# Patient Record
Sex: Female | Born: 1945 | Hispanic: No | Smoking: Former smoker
Health system: Southern US, Community
[De-identification: ages and names within clinical notes are randomized; demographics above are authoritative.]

## PROBLEM LIST (undated history)

## (undated) DIAGNOSIS — E079 Disorder of thyroid, unspecified: Secondary | ICD-10-CM

## (undated) DIAGNOSIS — Z8619 Personal history of other infectious and parasitic diseases: Secondary | ICD-10-CM

## (undated) DIAGNOSIS — M8580 Other specified disorders of bone density and structure, unspecified site: Secondary | ICD-10-CM

## (undated) DIAGNOSIS — E785 Hyperlipidemia, unspecified: Secondary | ICD-10-CM

## (undated) DIAGNOSIS — M5020 Other cervical disc displacement, unspecified cervical region: Secondary | ICD-10-CM

## (undated) DIAGNOSIS — I6350 Cerebral infarction due to unspecified occlusion or stenosis of unspecified cerebral artery: Secondary | ICD-10-CM

## (undated) DIAGNOSIS — G4733 Obstructive sleep apnea (adult) (pediatric): Secondary | ICD-10-CM

## (undated) DIAGNOSIS — K219 Gastro-esophageal reflux disease without esophagitis: Secondary | ICD-10-CM

## (undated) DIAGNOSIS — F419 Anxiety disorder, unspecified: Secondary | ICD-10-CM

## (undated) DIAGNOSIS — M1990 Unspecified osteoarthritis, unspecified site: Secondary | ICD-10-CM

## (undated) DIAGNOSIS — I10 Essential (primary) hypertension: Secondary | ICD-10-CM

## (undated) HISTORY — DX: Other specified disorders of bone density and structure, unspecified site: M85.80

## (undated) HISTORY — DX: Other cervical disc displacement, unspecified cervical region: M50.20

## (undated) HISTORY — DX: Gastro-esophageal reflux disease without esophagitis: K21.9

## (undated) HISTORY — DX: Personal history of other infectious and parasitic diseases: Z86.19

## (undated) HISTORY — DX: Anxiety disorder, unspecified: F41.9

## (undated) HISTORY — DX: Disorder of thyroid, unspecified: E07.9

## (undated) HISTORY — DX: Hyperlipidemia, unspecified: E78.5

## (undated) HISTORY — DX: Obstructive sleep apnea (adult) (pediatric): G47.33

## (undated) HISTORY — DX: Unspecified osteoarthritis, unspecified site: M19.90

## (undated) HISTORY — PX: CHOLECYSTECTOMY: SHX55

## (undated) HISTORY — DX: Essential (primary) hypertension: I10

## (undated) HISTORY — DX: Cerebral infarction due to unspecified occlusion or stenosis of unspecified cerebral artery: I63.50

## (undated) HISTORY — PX: ABDOMINAL HYSTERECTOMY: SHX81

## (undated) HISTORY — PX: AUGMENTATION MAMMAPLASTY: SUR837

---

## 1986-06-20 HISTORY — PX: SPINAL FUSION: SHX223

## 1998-06-20 DIAGNOSIS — M791 Myalgia, unspecified site: Secondary | ICD-10-CM

## 2004-06-20 DIAGNOSIS — G2581 Restless legs syndrome: Secondary | ICD-10-CM

## 2004-06-20 HISTORY — PX: TOTAL HIP ARTHROPLASTY: SHX124

## 2006-07-30 DIAGNOSIS — E7801 Familial hypercholesterolemia: Secondary | ICD-10-CM

## 2006-07-30 DIAGNOSIS — Z86010 Personal history of colonic polyps: Secondary | ICD-10-CM

## 2006-07-30 DIAGNOSIS — M5417 Radiculopathy, lumbosacral region: Secondary | ICD-10-CM

## 2006-07-30 DIAGNOSIS — G4733 Obstructive sleep apnea (adult) (pediatric): Secondary | ICD-10-CM | POA: Insufficient documentation

## 2006-07-30 DIAGNOSIS — I10 Essential (primary) hypertension: Secondary | ICD-10-CM

## 2006-07-30 DIAGNOSIS — E039 Hypothyroidism, unspecified: Secondary | ICD-10-CM | POA: Insufficient documentation

## 2006-11-01 ENCOUNTER — Ambulatory Visit: Payer: Self-pay | Admitting: Family Medicine

## 2006-11-01 DIAGNOSIS — F411 Generalized anxiety disorder: Secondary | ICD-10-CM

## 2006-11-26 ENCOUNTER — Emergency Department: Payer: Self-pay | Admitting: Emergency Medicine

## 2006-11-26 ENCOUNTER — Other Ambulatory Visit: Payer: Self-pay

## 2006-12-09 DIAGNOSIS — M5136 Other intervertebral disc degeneration, lumbar region: Secondary | ICD-10-CM

## 2006-12-09 DIAGNOSIS — I6350 Cerebral infarction due to unspecified occlusion or stenosis of unspecified cerebral artery: Secondary | ICD-10-CM

## 2007-03-22 DIAGNOSIS — R609 Edema, unspecified: Secondary | ICD-10-CM

## 2007-04-25 ENCOUNTER — Ambulatory Visit: Payer: Self-pay | Admitting: Cardiology

## 2007-05-09 DIAGNOSIS — I2510 Atherosclerotic heart disease of native coronary artery without angina pectoris: Secondary | ICD-10-CM

## 2007-09-17 ENCOUNTER — Encounter: Payer: Self-pay | Admitting: Family Medicine

## 2007-09-19 ENCOUNTER — Encounter: Payer: Self-pay | Admitting: Family Medicine

## 2007-10-19 ENCOUNTER — Encounter: Payer: Self-pay | Admitting: Family Medicine

## 2008-02-29 ENCOUNTER — Ambulatory Visit: Payer: Self-pay | Admitting: Family Medicine

## 2008-02-29 DIAGNOSIS — K5900 Constipation, unspecified: Secondary | ICD-10-CM

## 2009-08-09 ENCOUNTER — Ambulatory Visit: Payer: Self-pay | Admitting: Internal Medicine

## 2009-12-09 DIAGNOSIS — L659 Nonscarring hair loss, unspecified: Secondary | ICD-10-CM

## 2010-02-23 DIAGNOSIS — R5381 Other malaise: Secondary | ICD-10-CM | POA: Insufficient documentation

## 2010-02-23 DIAGNOSIS — R5383 Other fatigue: Secondary | ICD-10-CM

## 2010-05-10 ENCOUNTER — Ambulatory Visit: Payer: Self-pay | Admitting: Internal Medicine

## 2010-05-18 ENCOUNTER — Ambulatory Visit: Payer: Self-pay | Admitting: Internal Medicine

## 2010-07-21 HISTORY — PX: OTHER SURGICAL HISTORY: SHX169

## 2011-03-10 ENCOUNTER — Ambulatory Visit: Payer: Self-pay | Admitting: Internal Medicine

## 2011-05-24 ENCOUNTER — Ambulatory Visit: Payer: Self-pay | Admitting: Gastroenterology

## 2011-05-24 LAB — HM COLONOSCOPY

## 2012-07-04 ENCOUNTER — Ambulatory Visit: Payer: Self-pay | Admitting: Ophthalmology

## 2012-07-17 ENCOUNTER — Ambulatory Visit: Payer: Self-pay | Admitting: Ophthalmology

## 2013-01-04 ENCOUNTER — Ambulatory Visit: Payer: Self-pay | Admitting: Family Medicine

## 2013-01-04 HISTORY — PX: OTHER SURGICAL HISTORY: SHX169

## 2013-01-04 LAB — CREATININE, SERUM
Creatinine: 1.08 mg/dL (ref 0.60–1.30)
EGFR (African American): >60
EGFR (Non-African Amer.): 53 — ABNORMAL LOW

## 2013-04-01 ENCOUNTER — Ambulatory Visit: Payer: Self-pay | Admitting: Otolaryngology

## 2013-04-04 HISTORY — PX: DOPPLER ECHOCARDIOGRAPHY: SHX263

## 2013-04-05 HISTORY — PX: OTHER SURGICAL HISTORY: SHX169

## 2013-04-10 ENCOUNTER — Ambulatory Visit: Payer: Self-pay | Admitting: Otolaryngology

## 2013-04-12 LAB — PATHOLOGY REPORT

## 2014-05-05 LAB — LIPID PANEL
Cholesterol: 200 mg/dL (ref 0–200)
HDL: 42 mg/dL (ref 35–70)
LDL Cholesterol: 110 mg/dL
Triglycerides: 241 mg/dL — AB (ref 40–160)

## 2014-05-22 ENCOUNTER — Ambulatory Visit: Payer: Self-pay | Admitting: Family Medicine

## 2014-05-22 DIAGNOSIS — M8580 Other specified disorders of bone density and structure, unspecified site: Secondary | ICD-10-CM | POA: Insufficient documentation

## 2014-05-22 LAB — HM DEXA SCAN

## 2014-07-23 DIAGNOSIS — E039 Hypothyroidism, unspecified: Secondary | ICD-10-CM | POA: Diagnosis not present

## 2014-08-14 DIAGNOSIS — Z8673 Personal history of transient ischemic attack (TIA), and cerebral infarction without residual deficits: Secondary | ICD-10-CM | POA: Insufficient documentation

## 2014-08-14 DIAGNOSIS — I10 Essential (primary) hypertension: Secondary | ICD-10-CM | POA: Diagnosis not present

## 2014-08-14 DIAGNOSIS — I2510 Atherosclerotic heart disease of native coronary artery without angina pectoris: Secondary | ICD-10-CM | POA: Diagnosis not present

## 2014-08-14 DIAGNOSIS — E782 Mixed hyperlipidemia: Secondary | ICD-10-CM | POA: Diagnosis not present

## 2014-08-25 DIAGNOSIS — G2581 Restless legs syndrome: Secondary | ICD-10-CM | POA: Diagnosis not present

## 2014-08-25 DIAGNOSIS — R5381 Other malaise: Secondary | ICD-10-CM | POA: Diagnosis not present

## 2014-08-25 DIAGNOSIS — E559 Vitamin D deficiency, unspecified: Secondary | ICD-10-CM | POA: Diagnosis not present

## 2014-08-25 DIAGNOSIS — G4733 Obstructive sleep apnea (adult) (pediatric): Secondary | ICD-10-CM | POA: Diagnosis not present

## 2014-08-25 LAB — HEPATIC FUNCTION PANEL
ALT: 33 U/L (ref 7–35)
AST: 20 U/L (ref 13–35)

## 2014-08-25 LAB — BASIC METABOLIC PANEL
BUN: 14 mg/dL (ref 4–21)
CREATININE: 0.8 mg/dL (ref 0.5–1.1)
GLUCOSE: 95 mg/dL
POTASSIUM: 3.7 mmol/L (ref 3.4–5.3)
Sodium: 145 mmol/L (ref 137–147)

## 2014-08-25 LAB — CBC AND DIFFERENTIAL
HCT: 35 % — AB (ref 36–46)
Hemoglobin: 11.6 g/dL — AB (ref 12.0–16.0)
Platelets: 348 10*3/uL (ref 150–399)
WBC: 7.9 10*3/mL

## 2014-09-18 DIAGNOSIS — R0602 Shortness of breath: Secondary | ICD-10-CM | POA: Diagnosis not present

## 2014-10-10 NOTE — Op Note (Signed)
PATIENT NAME:  Morgan Lynch, Morgan Lynch MR#:  [id number] DATE OF BIRTH:  08-Apr-1946  DATE OF PROCEDURE:  04/10/2013  PREOPERATIVE DIAGNOSES: Chronic maxillary, ethmoid, and frontal sinusitis.   POSTOPERATIVE DIAGNOSES: Chronic maxillary, ethmoid, and frontal sinusitis.   PROCEDURES:  1.  Bilateral endoscopic maxillary antrostomies with tissue removal.  2.  Bilateral endoscopic anterior ethmoidectomies.  3.  Bilateral endoscopic frontal recess exploration with tissue removal.  4.  Computer-assisted image-guided surgery.     SURGEON: Marion Downer, MD.  ANESTHESIA: General endotracheal.   INDICATIONS: This is a 69 year old female with a history of chronic sinusitis unresponsive to medical management.   FINDINGS: She had bilateral mucopyoceles involving the maxillary sinuses. There was also puss in the left frontal sinus. A culture was taken from the purulence in the left maxillary. There was significant mucosal slit swelling and polypoid mucosal thickening involving the frontal maxillary and ethmoid sinuses bilaterally.   COMPLICATIONS: None.   DESCRIPTION OF PROCEDURE: After obtaining informed consent, the patient was taken to the operating room and placed in the supine position. After induction of general endotracheal anesthesia, the patient was turned 90 degrees. The nose was decongested with Afrin pledgets. Then 1% lidocaine with epinephrine 1:200,000 was injected into the middle meatus on either side in the region of the uncinate process and middle turbinate. The Stryker image-guided mask was then placed in the usual fashion and the patient registered with the image-guided system. She was then prepped and draped in the usual sterile fashion.   The left nasal cavity was inspected endoscopically with a 0-degree scope. The image-guided suction was registered with the system and known points assessed to checked for accuracy which was deemed to be excellent.   The uncinate process was resected using  a combination of pediatric backbiters through-cutting forceps, and the microdebrider. Soft tissue and bone were removed from the region of the natural ostium back to the secondary os to create a large patent maxillary antrostomy. Pus was found in the maxillary sinus and this was suctioned, cultured, and then the sinus irrigated. The image-guided suction was used to assess the anatomy and the anterior ethmoids entered inferomedially and widely opened.   Dissection proceeded up into the frontal recess using through-cutting forceps to resect soft tissue and bone to open the frontal recess until the frontal duct was identified. A curved image-guided suction was used to frequently reassess the anatomy during this dissection to ensure safety. The frontal duct was identified. There was a lot of mucosal thickening in that area and this was resected at least anteriorly with a frontal backbiter to help open the frontal duct adequately. The mucosa laterally and posteriorly was not disturbed so as not to encourage scarring. The frontal duct was widely opened in this fashion.   Attention was then turned to the right side. The same procedure was performed on the right, again opening a large maxillary antrostomy as described above, opening the anterior ethmoids and dissecting the frontal recess. Once again image-guided suctions were used to help reassess the anatomy frequently to ensure safety and prevent injury to the skull base or lamina papyracea.   Once the procedure was completed, the nose was suctioned to remove any blood clot and Stammberger absorbable packing was placed in the middle meatus bilaterally. The patient was then returned to the anesthesiologist for awakening. She was awakened and taken to the recovery room in good condition postoperatively.   ESTIMATED BLOOD LOSS: Approximately 75 mL.   ____________________________ Ollen Gross. Willeen Cass, MD psb:np D:  04/10/2013 14:27:01 ET T: 04/10/2013 15:10:05  ET JOB#: [id number]  cc: Ollen GrossPaul S. Willeen CassBennett, MD, <Dictator> Sandi MealyPAUL S Shaurya Rawdon MD ELECTRONICALLY SIGNED 04/23/2013 12:07

## 2014-10-10 NOTE — Op Note (Signed)
PATIENT NAME:  [name], Morgan Lynch MR#:  [id number] DATE OF BIRTH:  [birth year]  DATE OF PROCEDURE:  07/17/2012  PREOPERATIVE DIAGNOSIS: Visually significant cataract of the left eye.   POSTOPERATIVE DIAGNOSIS: Visually significant cataract of the left eye.   OPERATIVE PROCEDURE: Cataract extraction by phacoemulsification with implant of intraocular lens to left eye.   SURGEON: Galen ManilaWilliam Laster Appling, MD   ANESTHESIA:  1.  Managed anesthesia care.  2.  Topical tetracaine drops followed by 2% Xylocaine jelly applied in the preoperative holding area.   COMPLICATIONS: None.   TECHNIQUE: Stop and chop.   DESCRIPTION OF PROCEDURE: The patient was examined and consented in the preoperative holding area where the aforementioned topical anesthesia was applied to the left eye and then brought back to the Operating Room where the left eye was prepped and draped in the usual sterile ophthalmic fashion and a lid speculum was placed. A paracentesis was created with the side port blade and the anterior chamber was filled with viscoelastic. A near clear corneal incision was performed with the steel keratome. A continuous curvilinear capsulorrhexis was performed with a cystotome followed by the capsulorrhexis forceps. Hydrodissection and hydrodelineation were carried out with BSS on a blunt cannula. The lens was removed in a stop and chop technique and the remaining cortical material was removed with the irrigation-aspiration handpiece. The capsular bag was inflated with viscoelastic and the Technis ZCT 225 18.5-diopter lens, serial number [id number] was placed in the capsular bag without complication. The lens was rotated to a final resting position of 88 degrees.  The remaining viscoelastic was removed from the eye with the irrigation-aspiration handpiece. The wounds were hydrated. The anterior chamber was flushed with Miostat and the eye was inflated to physiologic pressure. 0.1 mL of cefuroxime concentration 10 mg/mL  was placed in the anterior chamber. The wounds were found to be water tight. The eye was dressed with Vigamox. The patient was given protective glasses to wear throughout the day and a shield with which to sleep tonight. The patient was also given drops with which to begin a drop regimen today and will follow-up with me in one day.     ____________________________ Jerilee FieldWilliam L. Nyrah Demos, MD wlp:cc D: 07/17/2012 15:49:00 ET T: 07/17/2012 16:42:51 ET JOB#: [id number]  cc: Nick Armel L. Taeya Theall, MD, <Dictator>  Jerilee FieldWILLIAM L Constantine Ruddick MD ELECTRONICALLY SIGNED 07/26/2012 14:28

## 2014-10-22 DIAGNOSIS — Z0000 Encounter for general adult medical examination without abnormal findings: Secondary | ICD-10-CM | POA: Diagnosis not present

## 2014-10-22 DIAGNOSIS — J309 Allergic rhinitis, unspecified: Secondary | ICD-10-CM | POA: Diagnosis not present

## 2014-11-26 ENCOUNTER — Other Ambulatory Visit: Payer: Self-pay | Admitting: Family Medicine

## 2014-11-26 MED ORDER — NUVIGIL 250 MG PO TABS: 250.0000 mg | ORAL_TABLET | Freq: Every day | ORAL | Status: DC

## 2014-11-26 NOTE — Telephone Encounter (Signed)
Pt stated that [facility] was supposed to have request a refill for Nuvigill 250 mg and [facility] [place]. Thanks TNP

## 2014-11-26 NOTE — Telephone Encounter (Signed)
Refill request for Nuvigil 250mg  Last filled by MD on- 05/21/2014 #30 x5 Last Appt: 10/22/2014 Next Appt: none Please advise refill?

## 2014-12-29 ENCOUNTER — Other Ambulatory Visit: Payer: Self-pay | Admitting: Family Medicine

## 2014-12-29 NOTE — Telephone Encounter (Signed)
Pt contacted office for refill request on the following medications:  HYDROcodone-acetaminophen (NORCO) 7.5-325 MG.  CB#[phone]/MJ

## 2014-12-29 NOTE — Telephone Encounter (Signed)
Refill request for Hydro-Ace 7.5-325 mg Last filled by MD on- 11/03/2014 #120 x0 Last Appt: 10/22/2014 Next Appt: none Please advise refill?

## 2014-12-30 MED ORDER — HYDROCODONE-ACETAMINOPHEN 7.5-325 MG PO TABS
1.0000 | ORAL_TABLET | Freq: Four times a day (QID) | ORAL | Status: DC | PRN
Start: 2014-12-30 — End: 2015-03-06

## 2015-01-19 ENCOUNTER — Other Ambulatory Visit: Payer: Self-pay | Admitting: Family Medicine

## 2015-01-19 NOTE — Telephone Encounter (Signed)
Last ov was on 10/22/2014

## 2015-01-26 ENCOUNTER — Other Ambulatory Visit: Payer: Self-pay | Admitting: Family Medicine

## 2015-01-26 DIAGNOSIS — Z1231 Encounter for screening mammogram for malignant neoplasm of breast: Secondary | ICD-10-CM

## 2015-01-28 ENCOUNTER — Other Ambulatory Visit: Payer: Self-pay | Admitting: Family Medicine

## 2015-01-28 ENCOUNTER — Ambulatory Visit
Admission: RE | Admit: 2015-01-28 | Discharge: 2015-01-28 | Disposition: A | Discharge status: Home / Self Care | Payer: Medicare Other | Source: Ambulatory Visit | Attending: Family Medicine | Admitting: Family Medicine

## 2015-01-28 DIAGNOSIS — Z1231 Encounter for screening mammogram for malignant neoplasm of breast: Secondary | ICD-10-CM | POA: Insufficient documentation

## 2015-02-06 ENCOUNTER — Other Ambulatory Visit: Payer: Self-pay | Admitting: Family Medicine

## 2015-02-17 ENCOUNTER — Other Ambulatory Visit: Payer: Self-pay | Admitting: Family Medicine

## 2015-03-04 DIAGNOSIS — K219 Gastro-esophageal reflux disease without esophagitis: Secondary | ICD-10-CM | POA: Insufficient documentation

## 2015-03-04 DIAGNOSIS — R200 Anesthesia of skin: Secondary | ICD-10-CM | POA: Insufficient documentation

## 2015-03-04 DIAGNOSIS — E669 Obesity, unspecified: Secondary | ICD-10-CM | POA: Insufficient documentation

## 2015-03-04 DIAGNOSIS — M479 Spondylosis, unspecified: Secondary | ICD-10-CM | POA: Insufficient documentation

## 2015-03-04 DIAGNOSIS — E559 Vitamin D deficiency, unspecified: Secondary | ICD-10-CM | POA: Insufficient documentation

## 2015-03-04 DIAGNOSIS — J309 Allergic rhinitis, unspecified: Secondary | ICD-10-CM | POA: Insufficient documentation

## 2015-03-04 DIAGNOSIS — R198 Other specified symptoms and signs involving the digestive system and abdomen: Secondary | ICD-10-CM | POA: Insufficient documentation

## 2015-03-04 DIAGNOSIS — R252 Cramp and spasm: Secondary | ICD-10-CM | POA: Insufficient documentation

## 2015-03-04 DIAGNOSIS — R143 Flatulence: Secondary | ICD-10-CM | POA: Insufficient documentation

## 2015-03-04 DIAGNOSIS — G4710 Hypersomnia, unspecified: Secondary | ICD-10-CM | POA: Insufficient documentation

## 2015-03-06 ENCOUNTER — Ambulatory Visit (INDEPENDENT_AMBULATORY_CARE_PROVIDER_SITE_OTHER): Payer: Medicare Other | Admitting: Family Medicine

## 2015-03-06 ENCOUNTER — Encounter: Payer: Self-pay | Admitting: Family Medicine

## 2015-03-06 VITALS — BP 116/64 | HR 76 | Temp 98.6°F | Resp 16 | Ht 61.0 in | Wt 210.0 lb

## 2015-03-06 DIAGNOSIS — G4733 Obstructive sleep apnea (adult) (pediatric): Secondary | ICD-10-CM

## 2015-03-06 DIAGNOSIS — M5417 Radiculopathy, lumbosacral region: Secondary | ICD-10-CM

## 2015-03-06 DIAGNOSIS — G4710 Hypersomnia, unspecified: Secondary | ICD-10-CM | POA: Diagnosis not present

## 2015-03-06 MED ORDER — NUVIGIL 250 MG PO TABS: 250.0000 mg | ORAL_TABLET | Freq: Every day | ORAL | Status: DC

## 2015-03-06 MED ORDER — HYDROCODONE-ACETAMINOPHEN 7.5-325 MG PO TABS: 1.0000 | ORAL_TABLET | Freq: Four times a day (QID) | ORAL | Status: DC | PRN

## 2015-03-06 NOTE — Patient Instructions (Signed)
Please call for referral to sleep specialist if not improving on Brand Name Nuvigil not helping.

## 2015-03-06 NOTE — Progress Notes (Signed)
Patient: Morgan Lynch Female    DOB: 07/27/1945   69 y.o.   MRN: [id number] Visit Date: 03/06/2015  Today's Provider: Mila Merry, MD   Chief Complaint  Patient presents with  . Sleeping Problem   Subjective:    HPI Excessive sleepiness: Patient comes in today stating that she has had problems with staying awake. Patient is currently taking Generic Nuvigil to help with excessive sleepiness. Patient reports that she was on the Brand name Nuvigil several months ago and it helped to control the sleepiness. Since changing to the Generic Nuvigil, patient states it is not working to help with hypersomnia. Patient reports that she has fallen asleep on 3 sepearate occasions while driving her car in the middle of the day. Patient described this as worsening.  She does have established history of sleep apnea and reports that she uses her CPAP consistently every night. She had o/n oximetry with her CPAP oin March of this year and only had one desaturation.      Allergies  Allergen Reactions  . Oysters  [Shellfish Allergy]   . Simvastatin     Muscle Aches   Previous Medications   ALBUTEROL (PROAIR HFA) 108 (90 BASE) MCG/ACT INHALER    Inhale 2 puffs into the lungs every 6 (six) hours as needed.    ASPIRIN 81 MG TABLET    Take 162 mg by mouth daily.    EPINEPHRINE (EPIPEN 2-PAK) 0.3 MG/0.3 ML IJ SOAJ INJECTION    EPIPEN 2-PAK, 0.3MG /0.3ML (Injection Solution Auto-injector)  1 (one) injection injection as directed for 0 days  Quantity: 1;  Refills: 1   Ordered :10-Jun-2014  Allene Dillon ;  Started 10-Jun-2014 Active   FLUOXETINE HCL, PMDD, 20 MG CAPS    Take 3 capsules by mouth daily.    FLUTICASONE (FLONASE) 50 MCG/ACT NASAL SPRAY    Place 2 sprays into both nostrils daily.   HYDROCODONE-ACETAMINOPHEN (NORCO) 7.5-325 MG PER TABLET    Take 1 tablet by mouth every 6 (six) hours as needed for moderate pain.   IPRATROPIUM (ATROVENT) 0.06 % NASAL SPRAY    Place into the nose.     LEVOTHYROXINE (SYNTHROID, LEVOTHROID) 175 MCG TABLET    Take 175 mcg by mouth daily.    MONTELUKAST (SINGULAIR) 10 MG TABLET    TAKE 1 TABLET BY MOUTH DAILY   NAPROXEN (NAPROSYN) 500 MG TABLET    Take 500 mg by mouth 2 (two) times daily as needed.    NUVIGIL 250 MG TABLET    Take 1 tablet (250 mg total) by mouth daily.   OMEPRAZOLE (PRILOSEC) 20 MG CAPSULE    Take 20 mg by mouth daily.    TIZANIDINE (ZANAFLEX) 4 MG TABLET    TAKE 1 TABLET BY MOUTH EVERY 8 HOURS AS NEEDED   TOPIRAMATE (TOPAMAX) 100 MG TABLET    TAKE 1 TABLET BY MOUTH TWICE DAILY   TOPIRAMATE (TOPAMAX) 6 MG/ML SUSP    Take by mouth.   TRIAMCINOLONE CREAM (KENALOG) 0.5 %    APPLY TOPICALLY UP TO THREE TIMES DAILY AS NEEDED   VITAMIN D, ERGOCALCIFEROL, (DRISDOL) 50000 UNITS CAPS CAPSULE    Take 50,000 Units by mouth every 7 (seven) days.     Review of Systems  Constitutional: Negative for fever, chills, appetite change and fatigue.  Respiratory: Negative for chest tightness and shortness of breath.   Cardiovascular: Negative for chest pain and palpitations.  Gastrointestinal: Positive for abdominal pain. Negative for nausea and  vomiting.  Neurological: Negative for dizziness and weakness.  Psychiatric/Behavioral: Positive for sleep disturbance (hypersomnia).    Social History  Substance Use Topics  . Smoking status: Former Smoker -- 0.50 packs/day for 8 years    Types: Cigarettes  . Smokeless tobacco: Not on file  . Alcohol Use: No    Objective:   BP 116/64 mmHg  Pulse 76  Temp(Src) 98.6 F (37 C) (Oral)  Resp 16  Ht 5\' 1"  (1.549 m)  Wt 210 lb (95.255 kg)  BMI 39.70 kg/m2  SpO2 97%  Physical Exam  General Appearance:    Alert, cooperative, no distress, obese  Eyes:    PERRL, conjunctiva/corneas clear, EOM's intact       Lungs:     Clear to auscultation bilaterally, respirations unlabored  Heart:    Regular rate and rhythm  Neurologic:   Awake, alert, oriented x 3. No apparent focal neurological            defect.          Assessment & Plan:     1. Hypersomnia She makes a clear link between this symptom and being changed from Brand name to generic Nuvigil. Will see how she does going back to brand name. If not better then she may need evaluation by neurology - NUVIGIL 250 MG tablet; Take 1 tablet (250 mg total) by mouth daily. Brand name medically necessary  Dispense: 30 tablet; Refill: 5  2. Obstructive sleep apnea Seems to be well controlled based on her self report compliance with CPAP and normal o/n oximetry in March.  - NUVIGIL 250 MG tablet; Take 1 tablet (250 mg total) by mouth daily. Brand name medically necessary  Dispense: 30 tablet; Refill: 5  3. Lumbosacral neuritis She request refill for pain medication which she states continues to work well.  - HYDROcodone-acetaminophen (NORCO) 7.5-325 MG per tablet; Take 1 tablet by mouth every 6 (six) hours as needed for moderate pain.  Dispense: 120 tablet; Refill: 0       Mila Merry, MD  [facility]

## 2015-03-09 ENCOUNTER — Telehealth: Payer: Self-pay | Admitting: Family Medicine

## 2015-03-19 ENCOUNTER — Other Ambulatory Visit: Payer: Self-pay | Admitting: Family Medicine

## 2015-03-24 ENCOUNTER — Other Ambulatory Visit: Payer: Self-pay | Admitting: Family Medicine

## 2015-03-28 ENCOUNTER — Other Ambulatory Visit: Payer: Self-pay | Admitting: Family Medicine

## 2015-04-04 ENCOUNTER — Other Ambulatory Visit: Payer: Self-pay | Admitting: Family Medicine

## 2015-04-07 ENCOUNTER — Other Ambulatory Visit: Payer: Self-pay | Admitting: Family Medicine

## 2015-04-07 MED ORDER — IPRATROPIUM BROMIDE 0.06 % NA SOLN: 2.0000 | Freq: Three times a day (TID) | NASAL | Status: DC

## 2015-04-07 NOTE — Telephone Encounter (Signed)
Pt contacted office for refill request on the following medications:  ipratropium (ATROVENT) 0.06 % nasal spray.  [facility] [place]

## 2015-04-09 ENCOUNTER — Other Ambulatory Visit: Payer: Self-pay | Admitting: Family Medicine

## 2015-04-09 DIAGNOSIS — M5417 Radiculopathy, lumbosacral region: Secondary | ICD-10-CM

## 2015-04-09 NOTE — Telephone Encounter (Signed)
Pt is requesting a refill of her HYDROcodone-acetaminophen (NORCO) 7.5-325 MG per tablet , please contact pt at [phone] when ready for pick-up

## 2015-04-10 MED ORDER — HYDROCODONE-ACETAMINOPHEN 7.5-325 MG PO TABS: 1.0000 | ORAL_TABLET | Freq: Four times a day (QID) | ORAL | Status: DC | PRN

## 2015-04-15 ENCOUNTER — Other Ambulatory Visit: Payer: Self-pay

## 2015-04-15 NOTE — Telephone Encounter (Signed)
Pharmacist called requesting refill on medication.

## 2015-04-17 MED ORDER — NYSTATIN 100000 UNIT/ML MT SUSP: OROMUCOSAL | Status: DC

## 2015-04-23 ENCOUNTER — Other Ambulatory Visit: Payer: Self-pay | Admitting: Family Medicine

## 2015-06-01 DIAGNOSIS — S76011A Strain of muscle, fascia and tendon of right hip, initial encounter: Secondary | ICD-10-CM | POA: Diagnosis not present

## 2015-06-01 DIAGNOSIS — M25511 Pain in right shoulder: Secondary | ICD-10-CM | POA: Diagnosis not present

## 2015-06-01 DIAGNOSIS — Z96641 Presence of right artificial hip joint: Secondary | ICD-10-CM | POA: Diagnosis not present

## 2015-06-10 ENCOUNTER — Other Ambulatory Visit: Payer: Self-pay | Admitting: Family Medicine

## 2015-06-17 ENCOUNTER — Other Ambulatory Visit: Payer: Self-pay | Admitting: Family Medicine

## 2015-06-18 ENCOUNTER — Ambulatory Visit (INDEPENDENT_AMBULATORY_CARE_PROVIDER_SITE_OTHER): Payer: Medicare Other | Admitting: Physician Assistant

## 2015-06-18 ENCOUNTER — Encounter: Payer: Self-pay | Admitting: Physician Assistant

## 2015-06-18 VITALS — BP 122/76 | HR 72 | Temp 98.8°F | Resp 18 | Wt 215.0 lb

## 2015-06-18 DIAGNOSIS — Z136 Encounter for screening for cardiovascular disorders: Secondary | ICD-10-CM | POA: Diagnosis not present

## 2015-06-18 DIAGNOSIS — R61 Generalized hyperhidrosis: Secondary | ICD-10-CM | POA: Diagnosis not present

## 2015-06-18 DIAGNOSIS — Z13220 Encounter for screening for lipoid disorders: Secondary | ICD-10-CM | POA: Diagnosis not present

## 2015-06-18 DIAGNOSIS — E039 Hypothyroidism, unspecified: Secondary | ICD-10-CM

## 2015-06-18 DIAGNOSIS — H66001 Acute suppurative otitis media without spontaneous rupture of ear drum, right ear: Secondary | ICD-10-CM | POA: Diagnosis not present

## 2015-06-18 DIAGNOSIS — R252 Cramp and spasm: Secondary | ICD-10-CM

## 2015-06-18 DIAGNOSIS — E538 Deficiency of other specified B group vitamins: Secondary | ICD-10-CM | POA: Diagnosis not present

## 2015-06-18 DIAGNOSIS — K141 Geographic tongue: Secondary | ICD-10-CM | POA: Diagnosis not present

## 2015-06-18 MED ORDER — AMOXICILLIN 875 MG PO TABS: 875.0000 mg | ORAL_TABLET | Freq: Two times a day (BID) | ORAL | Status: DC

## 2015-06-18 NOTE — Patient Instructions (Signed)
Otitis Media, Adult Otitis media is redness, soreness, and inflammation of the middle ear. Otitis media may be caused by allergies or, most commonly, by infection. Often it occurs as a complication of the common cold. SIGNS AND SYMPTOMS Symptoms of otitis media may include:  Earache.  Fever.  Ringing in your ear.  Headache.  Leakage of fluid from the ear. DIAGNOSIS To diagnose otitis media, your health care provider will examine your ear with an otoscope. This is an instrument that allows your health care provider to see into your ear in order to examine your eardrum. Your health care provider also will ask you questions about your symptoms. TREATMENT  Typically, otitis media resolves on its own within 3-5 days. Your health care provider may prescribe medicine to ease your symptoms of pain. If otitis media does not resolve within 5 days or is recurrent, your health care provider may prescribe antibiotic medicines if he or she suspects that a bacterial infection is the cause. HOME CARE INSTRUCTIONS   If you were prescribed an antibiotic medicine, finish it all even if you start to feel better.  Take medicines only as directed by your health care provider.  Keep all follow-up visits as directed by your health care provider. SEEK MEDICAL CARE IF:  You have otitis media only in one ear, or bleeding from your nose, or both.  You notice a lump on your neck.  You are not getting better in 3-5 days.  You feel worse instead of better. SEEK IMMEDIATE MEDICAL CARE IF:   You have pain that is not controlled with medicine.  You have swelling, redness, or pain around your ear or stiffness in your neck.  You notice that part of your face is paralyzed.  You notice that the bone behind your ear (mastoid) is tender when you touch it. MAKE SURE YOU:   Understand these instructions.  Will watch your condition.  Will get help right away if you are not doing well or get worse.   This  information is not intended to replace advice given to you by your health care provider. Make sure you discuss any questions you have with your health care provider.   Document Released: 03/11/2004 Document Revised: 06/27/2014 Document Reviewed: 01/01/2013 Elsevier Interactive Patient Education 2016 Else[employer] Inc. Muscle Cramps and Spasms Muscle cramps and spasms occur when a muscle or muscles tighten and you have no control over this tightening (involuntary muscle contraction). They are a common problem and can develop in any muscle. The most common place is in the calf muscles of the leg. Both muscle cramps and muscle spasms are involuntary muscle contractions, but they also have differences:   Muscle cramps are sporadic and painful. They may last a few seconds to a quarter of an hour. Muscle cramps are often more forceful and last longer than muscle spasms.  Muscle spasms may or may not be painful. They may also last just a few seconds or much longer. CAUSES  It is uncommon for cramps or spasms to be due to a serious underlying problem. In many cases, the cause of cramps or spasms is unknown. Some common causes are:   Overexertion.   Overuse from repetitive motions (doing the same thing over and over).   Remaining in a certain position for a long period of time.   Improper preparation, form, or technique while performing a sport or activity.   Dehydration.   Injury.   Side effects of some medicines.   Abnormally low  levels of the salts and ions in your blood (electrolytes), especially potassium and calcium. This could happen if you are taking water pills (diuretics) or you are pregnant.  Some underlying medical problems can make it more likely to develop cramps or spasms. These include, but are not limited to:   Diabetes.   Parkinson disease.   Hormone disorders, such as thyroid problems.   Alcohol abuse.   Diseases specific to muscles, joints, and bones.    Blood vessel disease where not enough blood is getting to the muscles.  HOME CARE INSTRUCTIONS   Stay well hydrated. Drink enough water and fluids to keep your urine clear or pale yellow.  It may be helpful to massage, stretch, and relax the affected muscle.  For tight or tense muscles, use a warm towel, heating pad, or hot shower water directed to the affected area.  If you are sore or have pain after a cramp or spasm, applying ice to the affected area may relieve discomfort.  Put ice in a plastic bag.  Place a towel between your skin and the bag.  Leave the ice on for 15-20 minutes, 03-04 times a day.  Medicines used to treat a known cause of cramps or spasms may help reduce their frequency or severity. Only take over-the-counter or prescription medicines as directed by your caregiver. SEEK MEDICAL CARE IF:  Your cramps or spasms get more severe, more frequent, or do not improve over time.  MAKE SURE YOU:   Understand these instructions.  Will watch your condition.  Will get help right away if you are not doing well or get worse.   This information is not intended to replace advice given to you by your health care provider. Make sure you discuss any questions you have with your health care provider.   Document Released: 11/26/2001 Document Revised: 10/01/2012 Document Reviewed: 05/23/2012 Elsevier Interactive Patient Education [employer]2016 [employer]

## 2015-06-18 NOTE — Progress Notes (Signed)
Patient ID: Morgan Lynch, female   DOB: 06-Oct-1945, 69 y.o.   MRN: [id number]       Patient: Morgan Lynch Female    DOB: [birth year]   69 y.o.   MRN: [id number] Visit Date: 06/18/2015  Today's Provider: Margaretann Loveless, PA-C   Chief Complaint  Patient presents with  . Spasms  . Night Sweats   Subjective:    HPI  Patient has had trouble with muscle cramps since Thanksgiving. She did end up seen [facility] and was put on Prednisone when cramps did not improve. She was also given a steroid injection into her right shoulder. Cramps are still there and did not improve much. She also states she keeps waking up at night with severe sweat and chills at times. Has not been able to check her temperature. She has been coughing up phlegm but it has been clear so far. She states that she does have chronic sinusitis but she has had surgery for this in the past. She also states she has been around a sick contact with her son. He went to [facility] was diagnosed with walking pneumonia.    Allergies  Allergen Reactions  . Oysters  [Shellfish Allergy]   . Simvastatin     Muscle Aches   Previous Medications   ASPIRIN 81 MG TABLET    Take 162 mg by mouth daily.    EPIPEN 2-PAK 0.3 MG/0.3ML SOAJ INJECTION    USE AS DIRECTED   FLUOXETINE (PROZAC) 20 MG TABLET    TAKE 3 TABLETS BY MOUTH DAILY   FLUOXETINE HCL, PMDD, 20 MG CAPS    Take 3 capsules by mouth daily.    FLUTICASONE (FLONASE) 50 MCG/ACT NASAL SPRAY    Place 2 sprays into both nostrils daily.   HYDROCODONE-ACETAMINOPHEN (NORCO) 7.5-325 MG TABLET    Take 1 tablet by mouth every 6 (six) hours as needed for moderate pain.   IPRATROPIUM (ATROVENT) 0.06 % NASAL SPRAY    Place 2 sprays into both nostrils 3 (three) times daily.   LEVOTHYROXINE (SYNTHROID, LEVOTHROID) 175 MCG TABLET    Take 175 mcg by mouth daily.    MONTELUKAST (SINGULAIR) 10 MG TABLET    TAKE 1 TABLET BY MOUTH DAILY   NAPROXEN (NAPROSYN) 500 MG TABLET    Take 500 mg by  mouth 2 (two) times daily as needed.    NUVIGIL 250 MG TABLET    Take 1 tablet (250 mg total) by mouth daily. Brand name medically necessary   NYSTATIN (MYCOSTATIN) 100000 UNIT/ML SUSPENSION    RINSE WITH 4 ML THOROUGHLY AND SPIT OUT FOUR TIMES DAILY   OMEPRAZOLE (PRILOSEC) 20 MG CAPSULE    Take 1 capsule (20 mg total) by mouth daily.   PROAIR HFA 108 (90 BASE) MCG/ACT INHALER    USE 2 PUFFS BY INHALATION EVERY 6 HOURS AS NEEDED   TIZANIDINE (ZANAFLEX) 4 MG TABLET    TAKE 1 TABLET BY MOUTH EVERY 8 HOURS AS NEEDED   TOPIRAMATE (TOPAMAX) 100 MG TABLET    TAKE 1 TABLET BY MOUTH TWICE DAILY   TOPIRAMATE (TOPAMAX) 6 MG/ML SUSP    Take by mouth. Reported on 06/18/2015   TRIAMCINOLONE CREAM (KENALOG) 0.5 %    APPLY TOPICALLY UP TO THREE TIMES DAILY AS NEEDED   VITAMIN D, ERGOCALCIFEROL, (DRISDOL) 50000 UNITS CAPS CAPSULE    Take 50,000 Units by mouth every 7 (seven) days.     Review of Systems  Constitutional: Positive for chills and fatigue. Negative  for fever (unsure).  HENT: Positive for ear pain (occasional), rhinorrhea and sinus pressure. Negative for congestion, postnasal drip, sneezing, sore throat, tinnitus, trouble swallowing and voice change.   Eyes: Negative.   Respiratory: Positive for cough and shortness of breath.   Cardiovascular: Positive for palpitations.  Gastrointestinal: Negative for nausea, vomiting and abdominal pain.  Musculoskeletal: Positive for myalgias and arthralgias.  Neurological: Negative for dizziness and headaches.    Social History  Substance Use Topics  . Smoking status: Former Smoker -- 0.50 packs/day for 8 years    Types: Cigarettes  . Smokeless tobacco: Not on file  . Alcohol Use: No   Objective:   BP 122/76 mmHg  Pulse 72  Temp(Src) 98.8 F (37.1 C)  Resp 18  Wt 215 lb (97.523 kg)  Physical Exam  Constitutional: She appears well-developed and well-nourished. No distress.  HENT:  Head: Normocephalic and atraumatic.  Right Ear: Hearing, external  ear and ear canal normal. Tympanic membrane is erythematous and bulging. A middle ear effusion is present.  Left Ear: Hearing, tympanic membrane, external ear and ear canal normal.  No middle ear effusion.  Nose: Nose normal. No mucosal edema or rhinorrhea. Right sinus exhibits no maxillary sinus tenderness and no frontal sinus tenderness. Left sinus exhibits no maxillary sinus tenderness and no frontal sinus tenderness.  Mouth/Throat: Uvula is midline, oropharynx is clear and moist and mucous membranes are normal. No oropharyngeal exudate, posterior oropharyngeal edema or posterior oropharyngeal erythema.    Eyes: Conjunctivae are normal. Pupils are equal, round, and reactive to light. Right eye exhibits no discharge. Left eye exhibits no discharge. No scleral icterus.  Neck: Normal range of motion. Neck supple. No tracheal deviation present. No thyromegaly present.  Cardiovascular: Normal rate, regular rhythm and normal heart sounds.  Exam reveals no gallop and no friction rub.   No murmur heard. Pulmonary/Chest: Effort normal and breath sounds normal. No stridor. No respiratory distress. She has no wheezes. She has no rales.  Lymphadenopathy:    She has no cervical adenopathy.  Skin: Skin is warm and dry. She is not diaphoretic.  Vitals reviewed.       Assessment & Plan:     1. Acute suppurative otitis media of right ear without spontaneous rupture of tympanic membrane, recurrence not specified On exam she did have an erythematous and bulging right tympanic membrane. I will treat her with amoxicillin as below for acute otitis media. I did advise her to make sure to stay well-hydrated and to get plenty of rest. She is to call the office if symptoms fail to improve with antibiotic. - amoxicillin (AMOXIL) 875 MG tablet; Take 1 tablet (875 mg total) by mouth 2 (two) times daily.  Dispense: 20 tablet; Refill: 0  2. Night sweats I will check labs as below since she has not had any lab work  done since March 2016. Upon exam there is no apparent cause for night sweats and chills and muscle spasms. She does have a history of fibromyalgia. She also has hypothyroidism and is on many medications. I will check labs to evaluate make sure that her thyroid panel is within normal limits, check to make sure she is not anemic as well as checking for any electrolyte abnormalities that may be the cause of the night sweats and muscle spasms. She is to call the office in the meantime if symptoms do not improve. - CBC with Differential - Comprehensive Metabolic Panel (CMET) - TSH  3. Muscle cramping See above medical  treatment plan. - CBC with Differential - Comprehensive Metabolic Panel (CMET) - TSH - Z61  4. Encounter for lipid screening for cardiovascular disease Being that we're checking other lab work we will go ahead and check her lipid panel as well since she has not had this checked in over a year. I will follow-up with her pending the results of this. - Lipid Profile  5. Geographic tongue Due to the findings of the possible geographic tong as well as the night sweats and the muscle spasms I will also check a B12 to make sure that she is not severely deficient. I will follow-up with her pending these results. - B12       Margaretann Loveless, PA-C  [facility]

## 2015-06-19 ENCOUNTER — Telehealth: Payer: Self-pay | Admitting: Family Medicine

## 2015-06-19 ENCOUNTER — Other Ambulatory Visit: Payer: Self-pay | Admitting: Family Medicine

## 2015-06-19 DIAGNOSIS — M5417 Radiculopathy, lumbosacral region: Secondary | ICD-10-CM

## 2015-06-19 LAB — COMPREHENSIVE METABOLIC PANEL
A/G RATIO: 1.8 (ref 1.1–2.5)
ALK PHOS: 97 IU/L (ref 39–117)
ALT: 31 IU/L (ref 0–32)
AST: 27 IU/L (ref 0–40)
Albumin: 4.4 g/dL (ref 3.5–4.8)
BILIRUBIN TOTAL: 0.3 mg/dL (ref 0.0–1.2)
BUN / CREAT RATIO: 15 (ref 11–26)
BUN: 14 mg/dL (ref 8–27)
CHLORIDE: 100 mmol/L (ref 96–106)
CO2: 22 mmol/L (ref 18–29)
Calcium: 10.2 mg/dL (ref 8.7–10.3)
Creatinine, Ser: 0.91 mg/dL (ref 0.57–1.00)
GFR calc non Af Amer: 64 mL/min/{1.73_m2} (ref >59)
GFR, EST AFRICAN AMERICAN: 74 mL/min/{1.73_m2} (ref >59)
Globulin, Total: 2.5 g/dL (ref 1.5–4.5)
Glucose: 96 mg/dL (ref 65–99)
POTASSIUM: 4.5 mmol/L (ref 3.5–5.2)
Sodium: 144 mmol/L (ref 134–144)
TOTAL PROTEIN: 6.9 g/dL (ref 6.0–8.5)

## 2015-06-19 LAB — LIPID PANEL
Chol/HDL Ratio: 3.6 ratio units (ref 0.0–4.4)
Cholesterol, Total: 227 mg/dL — ABNORMAL HIGH (ref 100–199)
HDL: 63 mg/dL (ref >39)
LDL Calculated: 139 mg/dL — ABNORMAL HIGH (ref 0–99)
Triglycerides: 123 mg/dL (ref 0–149)
VLDL Cholesterol Cal: 25 mg/dL (ref 5–40)

## 2015-06-19 LAB — CBC WITH DIFFERENTIAL/PLATELET
BASOS: 1 %
Basophils Absolute: 0.1 10*3/uL (ref 0.0–0.2)
EOS (ABSOLUTE): 0.2 10*3/uL (ref 0.0–0.4)
EOS: 2 %
HEMATOCRIT: 39.7 % (ref 34.0–46.6)
Hemoglobin: 13.5 g/dL (ref 11.1–15.9)
Immature Grans (Abs): 0 10*3/uL (ref 0.0–0.1)
Immature Granulocytes: 0 %
LYMPHS ABS: 3.6 10*3/uL — AB (ref 0.7–3.1)
Lymphs: 30 %
MCH: 31.3 pg (ref 26.6–33.0)
MCHC: 34 g/dL (ref 31.5–35.7)
MCV: 92 fL (ref 79–97)
MONOCYTES: 7 %
MONOS ABS: 0.9 10*3/uL (ref 0.1–0.9)
NEUTROS ABS: 7.3 10*3/uL — AB (ref 1.4–7.0)
Neutrophils: 60 %
Platelets: 393 10*3/uL — ABNORMAL HIGH (ref 150–379)
RBC: 4.31 x10E6/uL (ref 3.77–5.28)
RDW: 13.6 % (ref 12.3–15.4)
WBC: 12.2 10*3/uL — AB (ref 3.4–10.8)

## 2015-06-19 LAB — VITAMIN B12

## 2015-06-19 LAB — TSH: TSH: 0.021 u[IU]/mL — AB (ref 0.450–4.500)

## 2015-06-19 MED ORDER — LEVOTHYROXINE SODIUM 150 MCG PO TABS: 150.0000 ug | ORAL_TABLET | Freq: Every day | ORAL | Status: DC

## 2015-06-19 NOTE — Telephone Encounter (Signed)
Last OV: 06/18/2015  Last Refill: 04/10/2015

## 2015-06-19 NOTE — Addendum Note (Signed)
Addended by: Margaretann LovelessBURNETTE, JENNIFER M on: 06/19/2015 01:03 PM   Modules accepted: Orders

## 2015-06-19 NOTE — Telephone Encounter (Signed)
Pt needs refill HYDROcodone-acetaminophen (NORCO) 7.5-325 MG tablet Taking 04/10/15 -- Malva Limesonald E Fisher, MD Take 1 tablet by mouth every 6 (six) hours as needed for moderate   Ok for Tuesday  Thanks, Fortune Brandsteri

## 2015-06-23 MED ORDER — HYDROCODONE-ACETAMINOPHEN 7.5-325 MG PO TABS: 1.0000 | ORAL_TABLET | Freq: Four times a day (QID) | ORAL | Status: DC | PRN

## 2015-06-24 NOTE — Telephone Encounter (Signed)
Filled by Dr. Sherrie MustacheFisher.

## 2015-06-26 NOTE — Addendum Note (Signed)
Addended by: Benjiman CoreHAMBERS, ROSHENA L on: 06/26/2015 02:09 PM   Modules accepted: Orders

## 2015-06-29 ENCOUNTER — Other Ambulatory Visit: Payer: Self-pay | Admitting: Family Medicine

## 2015-07-03 ENCOUNTER — Ambulatory Visit
Admission: RE | Admit: 2015-07-03 | Discharge: 2015-07-03 | Disposition: A | Discharge status: Home / Self Care | Payer: Medicare Other | Source: Ambulatory Visit | Attending: Family Medicine | Admitting: Family Medicine

## 2015-07-03 ENCOUNTER — Encounter: Payer: Self-pay | Admitting: Family Medicine

## 2015-07-03 ENCOUNTER — Ambulatory Visit (INDEPENDENT_AMBULATORY_CARE_PROVIDER_SITE_OTHER): Payer: Medicare Other | Admitting: Family Medicine

## 2015-07-03 VITALS — BP 148/70 | HR 70 | Temp 97.6°F | Resp 16

## 2015-07-03 DIAGNOSIS — R059 Cough, unspecified: Secondary | ICD-10-CM

## 2015-07-03 DIAGNOSIS — G4710 Hypersomnia, unspecified: Secondary | ICD-10-CM | POA: Diagnosis not present

## 2015-07-03 DIAGNOSIS — R05 Cough: Secondary | ICD-10-CM

## 2015-07-03 DIAGNOSIS — G4733 Obstructive sleep apnea (adult) (pediatric): Secondary | ICD-10-CM | POA: Diagnosis not present

## 2015-07-03 DIAGNOSIS — R509 Fever, unspecified: Secondary | ICD-10-CM | POA: Diagnosis not present

## 2015-07-03 MED ORDER — MODAFINIL 200 MG PO TABS: 200.0000 mg | ORAL_TABLET | Freq: Every day | ORAL | Status: DC

## 2015-07-03 NOTE — Progress Notes (Signed)
Patient: Morgan Lynch Female    DOB: 14-Jun-1946   70 y.o.   MRN: [id number] Visit Date: 07/03/2015  Today's Provider: Mila Merryonald Fisher, MD   Chief Complaint  Patient presents with  . Night Sweats  . Chills   Subjective:    HPI Chills and Sweats:  Patient comes in today complaining of intermittent chills and sweats for the past 6-7 weeks. Patient was last seen on 06/18/2015 by Joycelyn ManJennifer Burnette PA for Night sweats, Otitis Media and muscle cramps. Labs were obtained showing signs of infection and also her Thyroid levels were off which could have been the cause of her symtoms. Patient's Levothyroxine was changed to 150mcg. Had mildly elevated WBC and was treated with amoxicillin Amoxicillin. Has had cough off on on since thanksgiving. Patient states she has completed all doses of the Amoxicillin and has been taking the new dose of Levothyroxine and symptoms of chills and sweats has not improved. Patient reports the muscle cramps has resolved.     Allergies  Allergen Reactions  . Oysters  [Shellfish Allergy]   . Simvastatin     Muscle Aches   Previous Medications   ASPIRIN 81 MG TABLET    Take 162 mg by mouth daily.    EPIPEN 2-PAK 0.3 MG/0.3ML SOAJ INJECTION    USE AS DIRECTED   FLUOXETINE (PROZAC) 20 MG CAPSULE    TAKE 3 CAPSULES BY MOUTH EVERY DAY   FLUOXETINE HCL, PMDD, 20 MG CAPS    Take 3 capsules by mouth daily.    FLUTICASONE (FLONASE) 50 MCG/ACT NASAL SPRAY    Place 2 sprays into both nostrils daily.   HYDROCODONE-ACETAMINOPHEN (NORCO) 7.5-325 MG TABLET    Take 1 tablet by mouth every 6 (six) hours as needed for moderate pain.   IPRATROPIUM (ATROVENT) 0.06 % NASAL SPRAY    Place 2 sprays into both nostrils 3 (three) times daily.   LEVOTHYROXINE (SYNTHROID, LEVOTHROID) 150 MCG TABLET    Take 1 tablet (150 mcg total) by mouth daily before breakfast.   MONTELUKAST (SINGULAIR) 10 MG TABLET    TAKE 1 TABLET BY MOUTH DAILY   NAPROXEN (NAPROSYN) 500 MG TABLET    Take 500 mg  by mouth 2 (two) times daily as needed.    NUVIGIL 250 MG TABLET    Take 1 tablet (250 mg total) by mouth daily. Brand name medically necessary   NYSTATIN (MYCOSTATIN) 100000 UNIT/ML SUSPENSION    RINSE WITH 4 ML THOROUGHLY AND SPIT OUT FOUR TIMES DAILY   OMEPRAZOLE (PRILOSEC) 20 MG CAPSULE    Take 1 capsule (20 mg total) by mouth daily.   PROAIR HFA 108 (90 BASE) MCG/ACT INHALER    USE 2 PUFFS BY INHALATION EVERY 6 HOURS AS NEEDED   TIZANIDINE (ZANAFLEX) 4 MG TABLET    TAKE 1 TABLET BY MOUTH EVERY 8 HOURS AS NEEDED   TOPIRAMATE (TOPAMAX) 100 MG TABLET    TAKE 1 TABLET BY MOUTH TWICE DAILY   TOPIRAMATE (TOPAMAX) 6 MG/ML SUSP    Take by mouth. Reported on 06/18/2015   TRIAMCINOLONE CREAM (KENALOG) 0.5 %    APPLY TOPICALLY UP TO THREE TIMES DAILY AS NEEDED   VITAMIN D, ERGOCALCIFEROL, (DRISDOL) 50000 UNITS CAPS CAPSULE    Take 50,000 Units by mouth every 7 (seven) days.     Review of Systems  Constitutional: Positive for chills, diaphoresis and fatigue. Negative for fever and appetite change.  HENT: Positive for ear pain (right ear), rhinorrhea, sneezing and  sore throat (off and on).   Respiratory: Positive for shortness of breath. Negative for chest tightness. Cough: productive with clear phlegm.   Cardiovascular: Positive for chest pain (near left breast; patient thinks pain is from breast implant). Negative for palpitations.  Gastrointestinal: Negative for nausea, vomiting and abdominal pain.  Musculoskeletal: Negative for myalgias.  Neurological: Positive for weakness, light-headedness, numbness (tingling in her legs) and headaches. Negative for dizziness.    Social History  Substance Use Topics  . Smoking status: Former Smoker -- 0.50 packs/day for 8 years    Types: Cigarettes  . Smokeless tobacco: Not on file  . Alcohol Use: No   Objective:   BP 148/70 mmHg  Pulse 70  Temp(Src) 97.6 F (36.4 C) (Oral)  Resp 16  SpO2 98%  Physical Exam   General Appearance:    Alert,  cooperative, no distress  Eyes:    PERRL, conjunctiva/corneas clear, EOM's intact       Lungs:     Clear to auscultation bilaterally, respirations unlabored  Heart:    Regular rate and rhythm  Neurologic:   Awake, alert, oriented x 3. No apparent focal neurological           defect.           Assessment & Plan:     1. Cough  - DG Chest 2 View; Future  2. Hypersomnia Change Nuvigil to Provigil due to insurance - modafinil (PROVIGIL) 200 MG tablet; Take 1 tablet (200 mg total) by mouth daily.  Dispense: 30 tablet; Refill: 5  3. Obstructive sleep apnea  - modafinil (PROVIGIL) 200 MG tablet; Take 1 tablet (200 mg total) by mouth daily.  Dispense: 30 tablet; Refill: 5       Mila Merry, MD  [facility]

## 2015-07-06 ENCOUNTER — Telehealth: Payer: Self-pay

## 2015-07-06 DIAGNOSIS — D72829 Elevated white blood cell count, unspecified: Secondary | ICD-10-CM

## 2015-07-06 MED ORDER — DOXYCYCLINE HYCLATE 100 MG PO TABS: 100.0000 mg | ORAL_TABLET | Freq: Two times a day (BID) | ORAL | Status: DC

## 2015-07-06 NOTE — Telephone Encounter (Signed)
Patient advised as directed below. Patient verbalized understanding. [facility]  Patient wanted to make Dr. Sherrie MustacheFisher aware that insurance denied paying for Nuvigil and also Provigil. Patient is requesting to start back taking Ritalin. She states she is having a "hard time" and needs medication.

## 2015-07-06 NOTE — Telephone Encounter (Signed)
-----   Message from Malva Limesonald E Fisher, MD sent at 07/04/2015  8:14 AM EST ----- No pneumonia seen on chest xray. She may just have some sinus or bronchial infection. Need to start doxycycline 100mg  twice daily for 10 days. Need to recheck  CBC in 10 days to make sure WBC comes down after finishing antibiotic.

## 2015-07-14 ENCOUNTER — Telehealth: Payer: Self-pay | Admitting: Family Medicine

## 2015-07-14 NOTE — Telephone Encounter (Signed)
rx has been approved by the insurance.  Pt advised.   Thanks,   -Vernona Rieger

## 2015-07-14 NOTE — Telephone Encounter (Signed)
Needs approval for her modafinil (PROVIGIL) 200 MG tablet  Patient says she has not taken since 1/11   She says the pharmacy says the insurance will not pay.  Her call back is [phone]

## 2015-07-15 NOTE — Telephone Encounter (Signed)
Please advise that Provigil was approved.

## 2015-07-17 ENCOUNTER — Ambulatory Visit: Payer: Self-pay | Admitting: Family Medicine

## 2015-07-21 ENCOUNTER — Ambulatory Visit (INDEPENDENT_AMBULATORY_CARE_PROVIDER_SITE_OTHER): Payer: Medicare Other | Admitting: Family Medicine

## 2015-07-21 ENCOUNTER — Encounter: Payer: Self-pay | Admitting: Family Medicine

## 2015-07-21 VITALS — BP 124/84 | HR 84 | Temp 98.9°F | Resp 16 | Ht 61.0 in | Wt 217.0 lb

## 2015-07-21 DIAGNOSIS — E039 Hypothyroidism, unspecified: Secondary | ICD-10-CM

## 2015-07-21 DIAGNOSIS — D72829 Elevated white blood cell count, unspecified: Secondary | ICD-10-CM

## 2015-07-21 DIAGNOSIS — J0110 Acute frontal sinusitis, unspecified: Secondary | ICD-10-CM | POA: Diagnosis not present

## 2015-07-21 MED ORDER — AMOXICILLIN-POT CLAVULANATE 875-125 MG PO TABS
1.0000 | ORAL_TABLET | Freq: Two times a day (BID) | ORAL | Status: DC
Start: 2015-07-21 — End: 2015-10-30

## 2015-07-21 NOTE — Progress Notes (Signed)
Patient: Morgan Lynch Female    DOB: September 21, 1945   70 y.o.   MRN: [id number] Visit Date: 07/21/2015  Today's Provider: Mila Merry, MD   Chief Complaint  Patient presents with  . Follow-up  . Cough  . Sinusitis   Subjective:    HPI Was seen by Rosemary Holms on 12-29 with OM, night sweats and muscle cramping, and found to have mildy elevated WBC of 12.2, and low TSH of 0.021. She was prescribed amoxicillin and levothyroxine was decreased to .  she returned 1/13 feeling no better and was prescribed doxycycline for suspected bronchitis, which she has finsihed. Chlls and sweats have mostly resolved, but started having a lot of heavy sinus congestion and green discharge over the last couple of days. Will be seeing Dr. Renae Fickle to check thyroid functions later this week.   Patient is still having sinus pressure, nasal congestion, cough, occasional sore throat.    Allergies  Allergen Reactions  . Oysters  [Shellfish Allergy]   . Simvastatin     Muscle Aches   Previous Medications   ASPIRIN 81 MG TABLET    Take 162 mg by mouth daily.    DOXYCYCLINE (VIBRA-TABS) 100 MG TABLET    Take 1 tablet (100 mg total) by mouth 2 (two) times daily.   EPIPEN 2-PAK 0.3 MG/0.3ML SOAJ INJECTION    USE AS DIRECTED   FLUOXETINE (PROZAC) 20 MG CAPSULE    TAKE 3 CAPSULES BY MOUTH EVERY DAY   FLUOXETINE HCL, PMDD, 20 MG CAPS    Take 3 capsules by mouth daily.    FLUTICASONE (FLONASE) 50 MCG/ACT NASAL SPRAY    Place 2 sprays into both nostrils daily.   HYDROCODONE-ACETAMINOPHEN (NORCO) 7.5-325 MG TABLET    Take 1 tablet by mouth every 6 (six) hours as needed for moderate pain.   IPRATROPIUM (ATROVENT) 0.06 % NASAL SPRAY    Place 2 sprays into both nostrils 3 (three) times daily.   LEVOTHYROXINE (SYNTHROID, LEVOTHROID) 150 MCG TABLET    Take 1 tablet (150 mcg total) by mouth daily before breakfast.   MODAFINIL (PROVIGIL) 200 MG TABLET    Take 1 tablet (200 mg total) by mouth daily.   MONTELUKAST (SINGULAIR) 10 MG TABLET    TAKE 1 TABLET BY MOUTH DAILY   NAPROXEN (NAPROSYN) 500 MG TABLET    Take 500 mg by mouth 2 (two) times daily as needed.    NUVIGIL 250 MG TABLET    Take 1 tablet (250 mg total) by mouth daily. Brand name medically necessary   NYSTATIN (MYCOSTATIN) 100000 UNIT/ML SUSPENSION    RINSE WITH 4 ML THOROUGHLY AND SPIT OUT FOUR TIMES DAILY   OMEPRAZOLE (PRILOSEC) 20 MG CAPSULE    Take 1 capsule (20 mg total) by mouth daily.   PROAIR HFA 108 (90 BASE) MCG/ACT INHALER    USE 2 PUFFS BY INHALATION EVERY 6 HOURS AS NEEDED   TIZANIDINE (ZANAFLEX) 4 MG TABLET    TAKE 1 TABLET BY MOUTH EVERY 8 HOURS AS NEEDED   TOPIRAMATE (TOPAMAX) 100 MG TABLET    TAKE 1 TABLET BY MOUTH TWICE DAILY   TOPIRAMATE (TOPAMAX) 6 MG/ML SUSP    Take by mouth. Reported on 06/18/2015   TRIAMCINOLONE CREAM (KENALOG) 0.5 %    APPLY TOPICALLY UP TO THREE TIMES DAILY AS NEEDED   VITAMIN D, ERGOCALCIFEROL, (DRISDOL) 50000 UNITS CAPS CAPSULE    Take 50,000 Units by mouth every 7 (seven) days.  Review of Systems  Constitutional: Negative for fever, chills, appetite change and fatigue.  HENT: Positive for congestion, ear pain, postnasal drip, rhinorrhea, sinus pressure and sore throat.   Respiratory: Positive for cough. Negative for chest tightness and shortness of breath.   Cardiovascular: Negative for chest pain and palpitations.  Gastrointestinal: Negative for nausea, vomiting and abdominal pain.  Neurological: Positive for headaches. Negative for dizziness and weakness.    Social History  Substance Use Topics  . Smoking status: Former Smoker -- 0.50 packs/day for 8 years    Types: Cigarettes  . Smokeless tobacco: Not on file  . Alcohol Use: No   Objective:   BP 124/84 mmHg  Pulse 84  Temp(Src) 98.9 F (37.2 C) (Oral)  Resp 16  Ht  (1.549 m)  Wt 217 lb (98.431 kg)  BMI 41.02 kg/m2  SpO2 97%  Physical Exam  General Appearance:    Alert, cooperative, no distress  HENT:    bilateral TM normal without fluid or infection, neck without nodes, frontal sinuses tender and nasal mucosa congested  Eyes:    PERRL, conjunctiva/corneas clear, EOM's intact       Lungs:     Clear to auscultation bilaterally, respirations unlabored  Heart:    Regular rate and rhythm  Neurologic:   Awake, alert, oriented x 3. No apparent focal neurological           defect.           Assessment & Plan:     1. Acute frontal sinusitis, recurrence not specified  - amoxicillin-clavulanate (AUGMENTIN) 875-125 MG tablet; Take 1 tablet by mouth 2 (two) times daily.  Dispense: 20 tablet; Refill: 0  2. Leukocytosis Time to recheck blood cell count - CBC  3. Hypothyroidism, unspecified hypothyroidism type Dr. Renae Fickle to recheck thyroid functions this week.         Mila Merry, MD  [facility]

## 2015-07-22 LAB — CBC
HEMOGLOBIN: 13.2 g/dL (ref 11.1–15.9)
Hematocrit: 39.8 % (ref 34.0–46.6)
MCH: 30.3 pg (ref 26.6–33.0)
MCHC: 33.2 g/dL (ref 31.5–35.7)
MCV: 92 fL (ref 79–97)
Platelets: 316 10*3/uL (ref 150–379)
RBC: 4.35 x10E6/uL (ref 3.77–5.28)
RDW: 13.5 % (ref 12.3–15.4)
WBC: 10.3 10*3/uL (ref 3.4–10.8)

## 2015-07-23 DIAGNOSIS — E039 Hypothyroidism, unspecified: Secondary | ICD-10-CM | POA: Diagnosis not present

## 2015-07-25 ENCOUNTER — Other Ambulatory Visit: Payer: Self-pay | Admitting: Family Medicine

## 2015-08-06 ENCOUNTER — Other Ambulatory Visit: Payer: Self-pay | Admitting: Family Medicine

## 2015-08-06 DIAGNOSIS — M5417 Radiculopathy, lumbosacral region: Secondary | ICD-10-CM

## 2015-08-06 MED ORDER — HYDROCODONE-ACETAMINOPHEN 7.5-325 MG PO TABS: 1.0000 | ORAL_TABLET | Freq: Four times a day (QID) | ORAL | Status: DC | PRN

## 2015-08-06 NOTE — Telephone Encounter (Signed)
Pt needs refill on her HYDROcodone-acetaminophen (NORCO) 7.5-325 MG tablet ° °Thanks Teri °

## 2015-08-18 DIAGNOSIS — G4733 Obstructive sleep apnea (adult) (pediatric): Secondary | ICD-10-CM | POA: Diagnosis not present

## 2015-09-04 ENCOUNTER — Other Ambulatory Visit: Payer: Self-pay

## 2015-09-04 DIAGNOSIS — M5417 Radiculopathy, lumbosacral region: Secondary | ICD-10-CM

## 2015-09-04 DIAGNOSIS — G4710 Hypersomnia, unspecified: Secondary | ICD-10-CM

## 2015-09-04 DIAGNOSIS — G4733 Obstructive sleep apnea (adult) (pediatric): Secondary | ICD-10-CM

## 2015-09-04 NOTE — Telephone Encounter (Signed)
Patient requesting refill on medication. She is requesting the brand name of Provigil. Thanks!

## 2015-09-07 MED ORDER — MODAFINIL 200 MG PO TABS: 400.0000 mg | ORAL_TABLET | Freq: Every day | ORAL | Status: DC

## 2015-09-07 MED ORDER — HYDROCODONE-ACETAMINOPHEN 7.5-325 MG PO TABS: 1.0000 | ORAL_TABLET | Freq: Four times a day (QID) | ORAL | Status: DC | PRN

## 2015-09-07 NOTE — Telephone Encounter (Signed)
Cal change to 2 tablets daily. Please call in rx for higher dose of Provigil. Hydrocodone will have to be picked up.

## 2015-09-07 NOTE — Telephone Encounter (Signed)
Patient reports that the generic "does not help" (patient did not clarify how medication was not helping after being asked several times). I advised patient that we needed a medical reason to change medication to brand name due to insurance purposes, and that it would take a few days to process this. Patient reports that she took her last pill this morning and would need a refill today if possible. Patient is requesting that we increase the generic brand of Provigil to see if that would resolve her symptoms. I also recommended that she make a F/U appt to discuss medication. She would like to see if you would increase dose first. Please advise. Thanks!

## 2015-09-07 NOTE — Telephone Encounter (Signed)
Patient was notified. Rx called into pharmacy.  

## 2015-09-07 NOTE — Telephone Encounter (Signed)
What is the medical reason that she cannot take generic Provigil. Insurance will require me to fill out a Prior Authorization form with detailed medical information if I write for brand name only.

## 2015-09-28 ENCOUNTER — Other Ambulatory Visit: Payer: Self-pay | Admitting: Physician Assistant

## 2015-09-28 DIAGNOSIS — E039 Hypothyroidism, unspecified: Secondary | ICD-10-CM

## 2015-10-10 ENCOUNTER — Encounter: Payer: Self-pay | Admitting: *Deleted

## 2015-10-10 ENCOUNTER — Ambulatory Visit
Admission: EM | Admit: 2015-10-10 | Discharge: 2015-10-10 | Disposition: A | Discharge status: Home / Self Care | Payer: Medicare Other | Attending: Family Medicine | Admitting: Family Medicine

## 2015-10-10 DIAGNOSIS — H6121 Impacted cerumen, right ear: Secondary | ICD-10-CM

## 2015-10-10 MED ORDER — CARBAMIDE PEROXIDE 6.5 % OT SOLN: 5.0000 [drp] | Freq: Two times a day (BID) | OTIC | Status: DC | PRN

## 2015-10-10 NOTE — ED Provider Notes (Signed)
CSN: [id number]     Arrival date & time 10/10/15  1330 History   First MD Initiated Contact with Patient 10/10/15 1433    Nurses notes were reviewed.  Chief Complaint  Patient presents with  . Otalgia    Patient reports right ear pain. She states that she saw nurse on Thursday who told her she had a lot of wax buildup in her ear. She started that time she need to the wax out of her ear and asked what was causing her to have discomfort in her ear. She was with a friend on Thursday so she did not do that but today this morning she decided to go ahead and try to get the wax out of her ear. She put some peroxide in her ear but when it was unsuccessful she put a washcloth in a year follow-up will peroxide and was called again. She thinks she pushed the wax into her ear canal even further is causing some discomfort now. She's had history of sinus infection about 2 months ago but doesn't report any nasal congestion or coughing at this time. She is she has multiple medical problems including thyroid disease hyperlipidemia anxiety hypertension sleep apnea arthritis. She's had a hip arthroplasty spinal fusion cholecystectomy abdominal hysterectomy as well. She does not smoke. And no pertinent family medical history in relation to this illness. She used to smoke and she's had a stroke before.    (Consider location/radiation/quality/duration/timing/severity/associated sxs/prior Treatment) Patient is a 70 y.o. female presenting with ear pain. The history is provided by the patient. No language interpreter was used.  Otalgia Location:  Right Behind ear:  No abnormality Severity:  Moderate Onset quality:  Sudden Timing:  Constant Progression:  Worsening Chronicity:  New Context: foreign body   Relieved by:  Nothing Associated symptoms: hearing loss   Associated symptoms: no congestion, no ear discharge, no fever, no headaches, no rhinorrhea and no sore throat   Risk factors: no chronic ear infection and no  prior ear surgery     Past Medical History  Diagnosis Date  . Thyroid disease   . Hyperlipidemia   . Anxiety   . Hypertension   . OSA (obstructive sleep apnea)   . GERD (gastroesophageal reflux disease)   . Cerebral artery occlusion with cerebral infarction (HCC)   . Osteopenia   . Arthritis   . History of chicken pox   . History of mumps   . History of measles    Past Surgical History  Procedure Laterality Date  . Total hip arthroplasty Left 2006  . Augmentation mammaplasty  1975 &1992    removal of implants in 1992  . Spinal fusion  1988    lower back  . Cholecystectomy    . Abdominal hysterectomy      unilateral OOP  . Myocardial perfusion scan  04/05/2013    Normal  . Doppler echocardiography  04/04/2013    mild MVR and TR. mild LVH  . Carotid doppler ultrasound  01/04/2013    No hemodynamically significant stenosis  . Mri brain,brain stem  01/04/2013    Chronic Ischemic changes. Old lacunar infarct. Severe sinusitis  . Pulmonary function test  07/21/2010    Normal; Done by Dr. Welton Flakes   Family History  Problem Relation Age of Onset  . Breast cancer Maternal Aunt   . Pancreatic cancer Father   . Colon polyps Father    Social History  Substance Use Topics  . Smoking status: Former Smoker -- 0.50 packs/day  for 8 years    Types: Cigarettes  . Smokeless tobacco: None  . Alcohol Use: No   OB History    Gravida Para Term Preterm AB TAB SAB Ectopic Multiple Living   6 5   1  1         Review of Systems  Constitutional: Negative for fever.  HENT: Positive for ear pain and hearing loss. Negative for congestion, ear discharge, rhinorrhea and sore throat.   Neurological: Negative for headaches.    Allergies  Oysters  and Simvastatin  Home Medications   Prior to Admission medications   Medication Sig Start Date End Date Taking? Authorizing Provider  aspirin 81 MG tablet Take 162 mg by mouth daily.  01/01/13  Yes Historical Provider, MD  FLUoxetine (PROZAC) 20  MG capsule TAKE 3 CAPSULES BY MOUTH EVERY DAY 06/29/15  Yes Malva Limesonald E Fisher, MD  Fluoxetine HCl, PMDD, 20 MG CAPS Take 3 capsules by mouth daily.  08/25/14  Yes Historical Provider, MD  fluticasone (FLONASE) 50 MCG/ACT nasal spray Place 2 sprays into both nostrils daily.   Yes Historical Provider, MD  HYDROcodone-acetaminophen (NORCO) 7.5-325 MG tablet Take 1 tablet by mouth every 6 (six) hours as needed for moderate pain. 09/07/15  Yes Malva Limesonald E Fisher, MD  ipratropium (ATROVENT) 0.06 % nasal spray INHALE 2 SPRAYS IN EACH NOSTRIL THREE TIMES DAILY 07/25/15  Yes Malva Limesonald E Fisher, MD  modafinil (PROVIGIL) 200 MG tablet Take 2 tablets (400 mg total) by mouth daily. 09/07/15  Yes Malva Limesonald E Fisher, MD  montelukast (SINGULAIR) 10 MG tablet TAKE 1 TABLET BY MOUTH DAILY 01/20/15  Yes Malva Limesonald E Fisher, MD  naproxen (NAPROSYN) 500 MG tablet Take 500 mg by mouth 2 (two) times daily as needed.  09/09/14  Yes Historical Provider, MD  omeprazole (PRILOSEC) 20 MG capsule Take 1 capsule (20 mg total) by mouth daily. 04/23/15  Yes Malva Limesonald E Fisher, MD  PROAIR HFA 108 [phone](90 BASE) MCG/ACT inhaler USE 2 PUFFS BY INHALATION EVERY 6 HOURS AS NEEDED 03/24/15  Yes Malva Limesonald E Fisher, MD  SYNTHROID 150 MCG tablet TAKE 1 TABLET(150 MCG) BY MOUTH DAILY BEFORE BREAKFAST 09/28/15  Yes Alessandra BevelsJennifer M Burnette, PA-C  tiZANidine (ZANAFLEX) 4 MG tablet TAKE 1 TABLET BY MOUTH EVERY 8 HOURS AS NEEDED 11/26/14  Yes Malva Limesonald E Fisher, MD  topiramate (TOPAMAX) 100 MG tablet TAKE 1 TABLET BY MOUTH TWICE DAILY 01/20/15  Yes Malva Limesonald E Fisher, MD  triamcinolone cream (KENALOG) 0.5 % APPLY TOPICALLY UP TO THREE TIMES DAILY AS NEEDED 02/17/15  Yes Malva Limesonald E Fisher, MD  Vitamin D, Ergocalciferol, (DRISDOL) 50000 UNITS CAPS capsule Take 50,000 Units by mouth every 7 (seven) days.  05/23/14  Yes Historical Provider, MD  amoxicillin-clavulanate (AUGMENTIN) 875-125 MG tablet Take 1 tablet by mouth 2 (two) times daily. 07/21/15   Malva Limesonald E Fisher, MD  carbamide peroxide (DEBROX) 6.5 %  otic solution Place 5 drops into both ears 2 (two) times daily as needed (For wax buildup). As needed for wax buildup 10/10/15   Hassan RowanEugene Georgiann Neider, MD  EPIPEN 2-PAK 0.3 MG/0.3ML SOAJ injection USE AS DIRECTED 06/10/15   Malva Limesonald E Fisher, MD  nystatin (MYCOSTATIN) 100000 UNIT/ML suspension RINSE WITH 4 ML THOROUGHLY AND SPIT OUT FOUR TIMES DAILY 06/17/15   Malva Limesonald E Fisher, MD  topiramate (TOPAMAX) 6 mg/mL SUSP Take by mouth. Reported on 06/18/2015    Historical Provider, MD   Meds Ordered and Administered this Visit  Medications - No data to display  BP 141/65 mmHg  Pulse 74  Temp(Src) 98 F (36.7 C) (Oral)  Resp 16  Ht  (1.549 m)  Wt 210 lb (95.255 kg)  BMI 39.70 kg/m2  SpO2 100% No data found.   Physical Exam  Constitutional: She is oriented to person, place, and time. She appears well-developed and well-nourished. No distress.  HENT:  Head: Normocephalic and atraumatic.  Right Ear: Hearing, tympanic membrane and external ear normal. A foreign body is present.  Left Ear: Tympanic membrane and ear canal normal.  Nose: No mucosal edema or rhinorrhea. Right sinus exhibits no maxillary sinus tenderness and no frontal sinus tenderness. Left sinus exhibits no maxillary sinus tenderness and no frontal sinus tenderness.  Mouth/Throat: No oral lesions. No dental abscesses. No posterior oropharyngeal erythema.  Assessment mild wax present in the right ear completely occluding the here this wax is very deep in the ear canal  Eyes: Pupils are equal, round, and reactive to light.  Neck: Normal range of motion. Neck supple. No tracheal deviation present. No thyromegaly present.  Musculoskeletal: Normal range of motion.  Lymphadenopathy:    She has no cervical adenopathy.  Neurological: She is alert and oriented to person, place, and time.  Skin: Skin is warm and dry. She is not diaphoretic.  Psychiatric: She has a normal mood and affect.  Vitals reviewed.   ED Course  .Ear Cerumen  Removal Date/Time: 10/10/2015 3:31 PM Performed by: Hassan Rowan Authorized by: Hassan Rowan Consent: Verbal consent obtained. Patient understanding: patient does not state understanding of the procedure being performed Site marked: the operative site was not marked Ceruminolytics applied: Ceruminolytics applied prior to the procedure. Location details: right ear Procedure type: irrigation Patient sedated: no Patient tolerance: Patient tolerated the procedure well with no immediate complications   (including critical care time)  Labs Review Labs Reviewed - No data to display  Imaging Review No results found.   Visual Acuity Review  Right Eye Distance:   Left Eye Distance:   Bilateral Distance:    Right Eye Near:   Left Eye Near:    Bilateral Near:         MDM   1. Excessive ear wax, right    Right ear looks great will prescribe some Debrox written prescriptions to use empiric basis in future follow-up Dr. Sherrie Mustache as needed. Note: This dictation was prepared with Dragon dictation along with smaller phrase technology. Any transcriptional errors that result from this process are unintentional.     Hassan Rowan, MD 10/10/15 [phone]

## 2015-10-10 NOTE — Discharge Instructions (Signed)
Cerumen Impaction °The structures of the external ear canal secrete a waxy substance known as cerumen. Excess cerumen can build up in the ear canal, causing a condition known as cerumen impaction. Cerumen impaction can cause ear pain and disrupt the function of the ear. °The rate of cerumen production differs for each individual. In certain individuals, the configuration of the ear canal may decrease his or her ability to naturally remove cerumen. °CAUSES °Cerumen impaction is caused by excessive cerumen production or buildup. °RISK FACTORS °· Frequent use of swabs to clean ears. °· Having narrow ear canals. °· Having eczema. °· Being dehydrated. °SIGNS AND SYMPTOMS °· Diminished hearing. °· Ear drainage. °· Ear pain. °· Ear itch. °TREATMENT °Treatment may involve: °· Over-the-counter or prescription ear drops to soften the cerumen. °· Removal of cerumen by a health care provider. This may be done with: °· Irrigation with warm water. This is the most common method of removal. °· Ear curettes and other instruments. °· Surgery. This may be done in severe cases. °HOME CARE INSTRUCTIONS °· Take medicines only as directed by your health care provider. °· Do not insert objects into the ear with the intent of cleaning the ear. °PREVENTION °· Do not insert objects into the ear, even with the intent of cleaning the ear. Removing cerumen as a part of normal hygiene is not necessary, and the use of swabs in the ear canal is not recommended. °· Drink enough water to keep your urine clear or pale yellow. °· Control your eczema if you have it. °SEEK MEDICAL CARE IF: °· You develop ear pain. °· You develop bleeding from the ear. °· The cerumen does not clear after you use ear drops as directed. °  °This information is not intended to replace advice given to you by your health care provider. Make sure you discuss any questions you have with your health care provider. °  °Document Released: 07/14/2004 Document Revised: 06/27/2014  Document Reviewed: 01/21/2015 °Elsevier Interactive Patient Education ©2016 [employer] ° °Ear Drops, Adult °You need to put eardrops in your ear. °HOME CARE  °· Put drops in your affected ear as told. °· After putting in the drops, lie down with the ear you put the drops in facing up. Stay this way for 10 minutes. Use the ear drops as long as your doctor tells you. °· Before you get up, put a cotton ball gently in your ear. Do not push it far in your ear. °· Do not wash out your ears unless your doctor says it is okay. °· Finish all medicines as told by your doctor. You may be told to keep using the eardrops even if you start to feel better. °· See your doctor as told for follow-up visits. °GET HELP IF: °· You have pain that gets worse. °· Any unusual fluid (drainage) is coming from your ear (especially if the fluid stinks). °· You have trouble hearing. °· You get really dizzy as if the room is spinning and feel sick to your stomach (vertigo). °· The outside of your ear becomes red or puffy or both. This may be a sign of an allergic reaction. °MAKE SURE YOU:  °· Understand these instructions. °· Will watch your condition. °· Will get help right away if you are not doing well or get worse. °  °This information is not intended to replace advice given to you by your health care provider. Make sure you discuss any questions you have with your health care provider. °  °  Document Released: 11/24/2009 Document Revised: 06/27/2014 Document Reviewed: 01/01/2013 °Elsevier Interactive Patient Education ©2016 [employer] ° °

## 2015-10-10 NOTE — ED Notes (Signed)
Right ear pain onset Thursday, states was told by a nurse to clean her ear out but she has been unsuccessful in doing that. Put peroxide in her ear this morning it has not come back out. States pain is much worse today.

## 2015-10-13 ENCOUNTER — Other Ambulatory Visit: Payer: Self-pay | Admitting: Family Medicine

## 2015-10-13 DIAGNOSIS — M5417 Radiculopathy, lumbosacral region: Secondary | ICD-10-CM

## 2015-10-13 MED ORDER — HYDROCODONE-ACETAMINOPHEN 7.5-325 MG PO TABS: 1.0000 | ORAL_TABLET | Freq: Four times a day (QID) | ORAL | Status: DC | PRN

## 2015-10-13 NOTE — Telephone Encounter (Signed)
Pt needs refill on her HYDROcodone-acetaminophen (NORCO) 7.5-325 MG tablet ° °Thanks Teri °

## 2015-10-18 ENCOUNTER — Other Ambulatory Visit: Payer: Self-pay | Admitting: Family Medicine

## 2015-10-22 ENCOUNTER — Other Ambulatory Visit: Payer: Self-pay | Admitting: Family Medicine

## 2015-10-29 ENCOUNTER — Other Ambulatory Visit: Payer: Self-pay

## 2015-10-29 DIAGNOSIS — M797 Fibromyalgia: Secondary | ICD-10-CM | POA: Insufficient documentation

## 2015-10-29 DIAGNOSIS — F32A Depression, unspecified: Secondary | ICD-10-CM | POA: Insufficient documentation

## 2015-10-29 DIAGNOSIS — F329 Major depressive disorder, single episode, unspecified: Secondary | ICD-10-CM | POA: Insufficient documentation

## 2015-10-29 DIAGNOSIS — G8929 Other chronic pain: Secondary | ICD-10-CM | POA: Insufficient documentation

## 2015-10-30 ENCOUNTER — Other Ambulatory Visit: Payer: Self-pay

## 2015-10-30 ENCOUNTER — Ambulatory Visit (INDEPENDENT_AMBULATORY_CARE_PROVIDER_SITE_OTHER): Payer: Medicare Other | Admitting: Family Medicine

## 2015-10-30 ENCOUNTER — Encounter: Payer: Self-pay | Admitting: Family Medicine

## 2015-10-30 VITALS — BP 124/70 | HR 80 | Temp 98.5°F | Resp 24 | Wt 214.0 lb

## 2015-10-30 DIAGNOSIS — H109 Unspecified conjunctivitis: Secondary | ICD-10-CM

## 2015-10-30 DIAGNOSIS — J0100 Acute maxillary sinusitis, unspecified: Secondary | ICD-10-CM

## 2015-10-30 MED ORDER — AMOXICILLIN-POT CLAVULANATE 875-125 MG PO TABS: 1.0000 | ORAL_TABLET | Freq: Two times a day (BID) | ORAL | Status: DC

## 2015-10-30 MED ORDER — SULFACETAMIDE-PREDNISOLONE 10-0.2 % OP SUSP: 2.0000 [drp] | Freq: Four times a day (QID) | OPHTHALMIC | Status: DC

## 2015-10-30 MED ORDER — NEOMYCIN-POLYMYXIN-HC 3.5-10000-1 OP SUSP: 2.0000 [drp] | Freq: Four times a day (QID) | OPHTHALMIC | Status: DC

## 2015-10-30 NOTE — Progress Notes (Signed)
Patient ID: Morgan Lynch, female   DOB: April 02, 1946, 70 y.o.   MRN: [id number]       Patient: Morgan Lynch Female    DOB: 1945/11/01   70 y.o.   MRN: [id number] Visit Date: 10/30/2015  Today's Provider: Dortha Kern, PA   Chief Complaint  Patient presents with  . URI    X 1 week    Subjective:    URI  This is a new problem. The current episode started in the past 7 days. The problem has been gradually worsening. Maximum temperature: felt feverish. Associated symptoms include chest pain, congestion, coughing, a plugged ear sensation, rhinorrhea, sinus pain, a sore throat and wheezing. She has tried decongestant and antihistamine for the symptoms. The treatment provided mild relief.   Patient also mentions that she has had eye drainage that is yellow in color. She reports that it was at its worse yesterday. Same yellow mucus from head with irrigation and cough.  Past Medical History  Diagnosis Date  . Thyroid disease   . Hyperlipidemia   . Anxiety   . Hypertension   . OSA (obstructive sleep apnea)   . GERD (gastroesophageal reflux disease)   . Cerebral artery occlusion with cerebral infarction (HCC)   . Osteopenia   . Arthritis   . History of chicken pox   . History of mumps   . History of measles    Past Surgical History  Procedure Laterality Date  . Total hip arthroplasty Left 2006  . Augmentation mammaplasty  1975 &1992    removal of implants in 1992  . Spinal fusion  1988    lower back  . Cholecystectomy    . Abdominal hysterectomy      unilateral OOP  . Myocardial perfusion scan  04/05/2013    Normal  . Doppler echocardiography  04/04/2013    mild MVR and TR. mild LVH  . Carotid doppler ultrasound  01/04/2013    No hemodynamically significant stenosis  . Mri brain,brain stem  01/04/2013    Chronic Ischemic changes. Old lacunar infarct. Severe sinusitis  . Pulmonary function test  07/21/2010    Normal; Done by Dr. Welton Flakes   Family History  Problem  Relation Age of Onset  . Breast cancer Maternal Aunt   . Pancreatic cancer Father   . Colon polyps Father    Allergies  Allergen Reactions  . Oysters  [Shellfish Allergy]   . Simvastatin     Muscle Aches   Previous Medications   AMOXICILLIN-CLAVULANATE (AUGMENTIN) 875-125 MG TABLET    Take 1 tablet by mouth 2 (two) times daily.   ASPIRIN 81 MG TABLET    Take 162 mg by mouth daily.    CARBAMIDE PEROXIDE (DEBROX) 6.5 % OTIC SOLUTION    Place 5 drops into both ears 2 (two) times daily as needed (For wax buildup). As needed for wax buildup   EPIPEN 2-PAK 0.3 MG/0.3ML SOAJ INJECTION    USE AS DIRECTED   FLUOXETINE (PROZAC) 20 MG CAPSULE    TAKE 3 CAPSULES BY MOUTH EVERY DAY   FLUOXETINE HCL, PMDD, 20 MG CAPS    Take 3 capsules by mouth daily.    FLUTICASONE (FLONASE) 50 MCG/ACT NASAL SPRAY    USE 2 SPRAYS IN EACH NOSTRIL EVERY NIGHT AT BEDTIME   HYDROCODONE-ACETAMINOPHEN (NORCO) 7.5-325 MG TABLET    Take 1 tablet by mouth every 6 (six) hours as needed for moderate pain.   IPRATROPIUM (ATROVENT) 0.06 % NASAL SPRAY  INHALE 2 SPRAYS IN EACH NOSTRIL THREE TIMES DAILY   MODAFINIL (PROVIGIL) 200 MG TABLET    Take 2 tablets (400 mg total) by mouth daily.   MONTELUKAST (SINGULAIR) 10 MG TABLET    TAKE 1 TABLET BY MOUTH DAILY   NAPROXEN (NAPROSYN) 500 MG TABLET    TAKE 1 TABLET BY MOUTH TWICE DAILY AS NEEDED   NYSTATIN (MYCOSTATIN) 100000 UNIT/ML SUSPENSION    RINSE WITH 4 ML THOROUGHLY AND SPIT OUT FOUR TIMES DAILY   OMEPRAZOLE (PRILOSEC) 20 MG CAPSULE    Take 1 capsule (20 mg total) by mouth daily.   PROAIR HFA 108 (90 BASE) MCG/ACT INHALER    USE 2 PUFFS BY INHALATION EVERY 6 HOURS AS NEEDED   SYNTHROID 150 MCG TABLET    TAKE 1 TABLET(150 MCG) BY MOUTH DAILY BEFORE BREAKFAST   TIZANIDINE (ZANAFLEX) 4 MG TABLET    TAKE 1 TABLET BY MOUTH EVERY 8 HOURS AS NEEDED   TOPIRAMATE (TOPAMAX) 100 MG TABLET    TAKE 1 TABLET BY MOUTH TWICE DAILY   TOPIRAMATE (TOPAMAX) 6 MG/ML SUSP    Take by mouth. Reported  on 06/18/2015   TRIAMCINOLONE CREAM (KENALOG) 0.5 %    APPLY TOPICALLY UP TO THREE TIMES DAILY AS NEEDED   VITAMIN D, ERGOCALCIFEROL, (DRISDOL) 50000 UNITS CAPS CAPSULE    Take 50,000 Units by mouth every 7 (seven) days.    Review of Systems  Constitutional: Positive for activity change and fatigue.  HENT: Positive for congestion, postnasal drip, rhinorrhea, sinus pressure and sore throat.   Eyes: Positive for discharge, redness and itching.  Respiratory: Positive for cough, shortness of breath and wheezing.   Cardiovascular: Positive for chest pain.   Social History  Substance Use Topics  . Smoking status: Former Smoker -- 0.50 packs/day for 8 years    Types: Cigarettes  . Smokeless tobacco: Not on file  . Alcohol Use: No   Objective:   BP 124/70 mmHg  Pulse 80  Temp(Src) 98.5 F (36.9 C)  Resp 24  Wt 214 lb (97.07 kg)  SpO2 99%  Physical Exam  Constitutional: She is oriented to person, place, and time. She appears well-developed and well-nourished. No distress.  HENT:  Head: Normocephalic and atraumatic.  Right Ear: Hearing and external ear normal.  Left Ear: Hearing and external ear normal.  Nose: Nose normal.  Cobblestone appearance posterior pharynx without exudates. Tender and poor transillumination of maxillary sinuses - history of past sinus surgery for severe infection.  Eyes: EOM and lids are normal. Right eye exhibits discharge. Left eye exhibits discharge. No scleral icterus.  Inflamed bilateral conjunctivae with yellow mucus discharge.  Neck: Neck supple.  Cardiovascular: Normal rate and regular rhythm.   Pulmonary/Chest: Effort normal and breath sounds normal. No respiratory distress. She has no wheezes. She has no rales.  Abdominal: Soft. Bowel sounds are normal.  Musculoskeletal: Normal range of motion.  Lymphadenopathy:    She has no cervical adenopathy.  Neurological: She is alert and oriented to person, place, and time.  Skin: Skin is intact. No lesion  and no rash noted.  Psychiatric: She has a normal mood and affect. Her speech is normal and behavior is normal. Thought content normal.      Assessment & Plan:     1. Acute maxillary sinusitis, recurrence not specified Onset over the past week with history of past sinus surgery for "bad infection". Also, history of allergies. Some chills and pressure headache with purulent nasal discharge. May use Mucinex-DM  for cough and congestion. Continue Singulair, Flonase and antihistamine if having itching or rhinorrhea. Add antibiotic and recheck in a week if no better. - amoxicillin-clavulanate (AUGMENTIN) 875-125 MG tablet; Take 1 tablet by mouth 2 (two) times daily.  Dispense: 20 tablet; Refill: 0  2. Bilateral conjunctivitis Fiery red palpebral conjunctivae bilaterally with purulent discharge. Treat with Cortisporin eye drops to help calm inflammation and itching. Apply warm compresses prn. Recheck if no better in 3-4 days or vision changes. - neomycin-polymyxin-hydrocortisone (CORTISPORIN) 3.5-10000-1 ophthalmic suspension; Place 2 drops into both eyes 4 (four) times daily.  Dispense: 7.5 mL; Refill: 0        Dortha Kern, PA  [facility]

## 2015-11-09 ENCOUNTER — Other Ambulatory Visit: Payer: Self-pay | Admitting: Family Medicine

## 2015-11-09 DIAGNOSIS — M5417 Radiculopathy, lumbosacral region: Secondary | ICD-10-CM

## 2015-11-09 NOTE — Telephone Encounter (Signed)
Pt states she needs a refill on her HYDROcodone-acetaminophen (NORCO) 7.5-325 MG tablet.  Pt also states she was seen by Maurine Ministerennis about 2 weeks ago and was put on a antibiotic and pt states she is still coughing and still has congestion and wanted to know if she needs another round of the antibiotic and if so pt uses [facility] in [place].  Pt states she would like a call to let her know if Dr is going to call her something into pharmacy.  Thanks CC

## 2015-11-09 NOTE — Telephone Encounter (Signed)
Please advise 

## 2015-11-10 ENCOUNTER — Other Ambulatory Visit: Payer: Self-pay | Admitting: Family Medicine

## 2015-11-10 NOTE — Telephone Encounter (Signed)
Pt called back regarding message below.  Please call back [phone];  Thanks, Barth Kirkseri

## 2015-11-11 ENCOUNTER — Telehealth: Payer: Self-pay | Admitting: *Deleted

## 2015-11-11 ENCOUNTER — Other Ambulatory Visit: Payer: Self-pay | Admitting: Family Medicine

## 2015-11-11 MED ORDER — HYDROCODONE-ACETAMINOPHEN 7.5-325 MG PO TABS: 1.0000 | ORAL_TABLET | Freq: Four times a day (QID) | ORAL | Status: DC | PRN

## 2015-11-11 NOTE — Telephone Encounter (Signed)
Advised patient as below. Patient scheduled appt to be seen tomorrow.

## 2015-11-11 NOTE — Telephone Encounter (Signed)
Pt states she was seen by Maurine Ministerennis about 2 weeks ago and was put on a antibiotic and pt states she is still coughing and still has congestion and wanted to know if she needs another round of the antibiotic and if so pt uses [facility] in [place]. Pt states she would like a call to let her know if Dr is going to call her something into pharmacy. Thanks CC

## 2015-11-11 NOTE — Telephone Encounter (Signed)
Should be better by now. Needs to return for re-evaluation. Can try OTC Mucinex if she is not already taking it.

## 2015-11-12 ENCOUNTER — Encounter: Payer: Self-pay | Admitting: Family Medicine

## 2015-11-12 ENCOUNTER — Ambulatory Visit: Payer: Self-pay | Admitting: Family Medicine

## 2015-11-12 ENCOUNTER — Ambulatory Visit (INDEPENDENT_AMBULATORY_CARE_PROVIDER_SITE_OTHER): Payer: Medicare Other | Admitting: Family Medicine

## 2015-11-12 VITALS — BP 136/84 | HR 76 | Temp 97.7°F | Resp 20 | Wt 215.0 lb

## 2015-11-12 DIAGNOSIS — R05 Cough: Secondary | ICD-10-CM

## 2015-11-12 DIAGNOSIS — R0600 Dyspnea, unspecified: Secondary | ICD-10-CM | POA: Diagnosis not present

## 2015-11-12 DIAGNOSIS — J0100 Acute maxillary sinusitis, unspecified: Secondary | ICD-10-CM | POA: Diagnosis not present

## 2015-11-12 DIAGNOSIS — E669 Obesity, unspecified: Secondary | ICD-10-CM | POA: Insufficient documentation

## 2015-11-12 DIAGNOSIS — R059 Cough, unspecified: Secondary | ICD-10-CM

## 2015-11-12 MED ORDER — PREDNISONE 5 MG PO TABS: ORAL_TABLET | ORAL | Status: DC

## 2015-11-12 NOTE — Progress Notes (Signed)
Patient ID: Morgan Lynch, female   DOB: 03-05-1946, 70 y.o.   MRN: [id number]       Patient: Morgan Lynch Female    DOB: 09-19-1945   70 y.o.   MRN: [id number] Visit Date: 11/12/2015  Today's Provider: Dortha Kern, PA   Chief Complaint  Patient presents with  . Cough    congestion    Subjective:    HPI Pt is here today because she is not feeling much better. She reports that she is feeling better, but not well. She is still coughing and congested. Her chest feels tight when she breathes and seems to be having a hard time breathing. She was treated with Augmentin on 10/30/15. She took her last dose this past Sunday.    Patient Active Problem List   Diagnosis Date Noted  . Adiposity 11/12/2015  . Fibromyalgia 10/29/2015  . Chronic pain 10/29/2015  . Clinical depression 10/29/2015  . Change in bowel function 03/04/2015  . Vitamin D deficiency 03/04/2015  . Obesity 03/04/2015  . Hypersomnia 03/04/2015  . Allergic rhinitis 03/04/2015  . GERD (gastroesophageal reflux disease) 03/04/2015  . Degenerative arthritis of spine 03/04/2015  . Facial numbness 03/04/2015  . Flatulence 03/04/2015  . TIA (transient ischemic attack) 03/04/2015  . Osteopenia 05/22/2014  . Malaise and fatigue 02/23/2010  . Alopecia 12/09/2009  . Constipation 02/29/2008  . CAD (coronary artery disease) 05/09/2007  . Edema 03/22/2007  . DDD (degenerative disc disease), lumbar 12/09/2006  . Cerebral artery occlusion with cerebral infarction (HCC) 12/09/2006  . Anxiety state 11/01/2006  . Hypothyroidism 07/30/2006  . Hyperlipidemia 07/30/2006  . Obstructive sleep apnea 07/30/2006  . Hypertension 07/30/2006  . 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 History of colon polyps 07/30/2006  . Restless leg 06/20/2004  . Muscle ache 06/20/1998   Past Surgical History  Procedure Laterality Date  . Total hip arthroplasty Left 2006  . Augmentation mammaplasty  1975 &1992    removal of implants in 1992  .  Spinal fusion  1988    lower back  . Cholecystectomy    . Abdominal hysterectomy      unilateral OOP  . Myocardial perfusion scan  04/05/2013    Normal  . Doppler echocardiography  04/04/2013    mild MVR and TR. mild LVH  . Carotid doppler ultrasound  01/04/2013    No hemodynamically significant stenosis  . Mri brain,brain stem  01/04/2013    Chronic Ischemic changes. Old lacunar infarct. Severe sinusitis  . Pulmonary function test  07/21/2010    Normal; Done by Dr. Welton Flakes   Family History  Problem Relation Age of Onset  . Breast cancer Maternal Aunt   . Pancreatic cancer Father   . Colon polyps Father    Allergies  Allergen Reactions  . Oysters  [Shellfish Allergy]   . Simvastatin     Muscle Aches   Previous Medications   ASPIRIN 81 MG TABLET    Take 162 mg by mouth daily.    EPIPEN 2-PAK 0.3 MG/0.3ML SOAJ INJECTION    USE AS DIRECTED   FLUOXETINE (PROZAC) 20 MG CAPSULE    TAKE 3 CAPSULES BY MOUTH EVERY DAY   FLUTICASONE (FLONASE) 50 MCG/ACT NASAL SPRAY    USE 2 SPRAYS IN EACH NOSTRIL EVERY NIGHT AT BEDTIME   HYDROCODONE-ACETAMINOPHEN (NORCO) 7.5-325 MG TABLET    Take 1 tablet by mouth every 6 (six) hours as needed for moderate pain.   IPRATROPIUM (ATROVENT) 0.06 % NASAL SPRAY    INHALE 2  SPRAYS IN EACH NOSTRIL THREE TIMES DAILY   MODAFINIL (PROVIGIL) 200 MG TABLET    Take 2 tablets (400 mg total) by mouth daily.   MONTELUKAST (SINGULAIR) 10 MG TABLET    TAKE 1 TABLET BY MOUTH DAILY   NAPROXEN (NAPROSYN) 500 MG TABLET    TAKE 1 TABLET BY MOUTH TWICE DAILY AS NEEDED   NEOMYCIN-POLYMYXIN-HYDROCORTISONE (CORTISPORIN) 3.5-10000-1 OPHTHALMIC SUSPENSION    Place 2 drops into both eyes 4 (four) times daily.   NYSTATIN (MYCOSTATIN) 100000 UNIT/ML SUSPENSION    RINSE WITH 4 ML THOROUGHLY AND SPIT OUT FOUR TIMES DAILY   OMEPRAZOLE (PRILOSEC) 20 MG CAPSULE    Take 1 capsule (20 mg total) by mouth daily.   PROAIR HFA 108 (90 BASE) MCG/ACT INHALER    USE 2 PUFFS BY INHALATION EVERY 6 HOURS  AS NEEDED   SULFACETAMIDE-PREDNISOLONE (BLEPHAMIDE) 10-0.2 % OPHTHALMIC SUSPENSION    Place 2 drops into both eyes 4 (four) times daily.   SYNTHROID 150 MCG TABLET    TAKE 1 TABLET(150 MCG) BY MOUTH DAILY BEFORE BREAKFAST   TIZANIDINE (ZANAFLEX) 4 MG TABLET    TAKE 1 TABLET BY MOUTH EVERY 8 HOURS AS NEEDED   TOPIRAMATE (TOPAMAX) 100 MG TABLET    TAKE 1 TABLET BY MOUTH TWICE DAILY   TOPIRAMATE (TOPAMAX) 6 MG/ML SUSP    Take by mouth. Reported on 06/18/2015   TRIAMCINOLONE CREAM (KENALOG) 0.5 %    APPLY TOPICALLY UP TO THREE TIMES DAILY AS NEEDED   VITAMIN D, ERGOCALCIFEROL, (DRISDOL) 50000 UNITS CAPS CAPSULE    Take 50,000 Units by mouth every 7 (seven) days.     Review of Systems  Constitutional: Positive for fatigue.  HENT: Positive for congestion, ear pain (right, intermittently) and postnasal drip.   Eyes: Negative.   Respiratory: Positive for cough, chest tightness and shortness of breath.   Cardiovascular: Negative.   Gastrointestinal: Negative.   Endocrine: Negative.   Genitourinary: Negative.   Musculoskeletal: Negative.   Skin: Negative.   Allergic/Immunologic: Negative.   Neurological: Negative.   Hematological: Negative.   Psychiatric/Behavioral: Negative.     Social History  Substance Use Topics  . Smoking status: Former Smoker -- 0.50 packs/day for 8 years    Types: Cigarettes  . Smokeless tobacco: Not on file  . Alcohol Use: No   Objective:   BP 136/84 mmHg  Pulse 76  Temp(Src) 97.7 F (36.5 C) (Oral)  Resp 20  Wt 215 lb (97.523 kg)  SpO2 98%  Physical Exam  Constitutional: She is oriented to person, place, and time. She appears well-developed and well-nourished. No distress.  HENT:  Head: Normocephalic and atraumatic.  Right Ear: Hearing and external ear normal.  Left Ear: Hearing and external ear normal.  Nose: Nose normal.  Mouth/Throat: Oropharynx is clear and moist.  No sinus tenderness.  Eyes: Conjunctivae and lids are normal. Right eye exhibits  no discharge. Left eye exhibits no discharge. No scleral icterus.  Neck: Normal range of motion. Neck supple.  Cardiovascular: Normal rate.   Pulmonary/Chest: Effort normal and breath sounds normal. No respiratory distress.  Abdominal: Soft. Bowel sounds are normal.  Musculoskeletal: Normal range of motion.  Neurological: She is alert and oriented to person, place, and time.  Skin: Skin is intact. No lesion and no rash noted.  Psychiatric: She has a normal mood and affect. Her speech is normal and behavior is normal. Thought content normal.       Assessment & Plan:     1. Cough  Improved today. Was worse 2-3 days ago. Spirometry normal. Suspect secondary to allergies. No wheezing today. Will treat with prednisone 5 mg 6 day taper. May use Tussin-DM prn. Continue Flonase, Singulair and Benadryl at bedtime. Recheck prn. If no better in 5-7 days, may need referral to allergist. - Spirometry with graph - predniSONE (DELTASONE) 5 MG tablet; Taper down by one tablet daily until all taken (6,5,4,3,2,1)  Dispense: 21 tablet; Refill: 0  2. Dyspnea Occurs with cough and nasal congestion. Normal spirometry. Respirations not labored today. Suspect allergy syndrome. Treat with prednisone taper. - Spirometry with graph - predniSONE (DELTASONE) 5 MG tablet; Taper down by one tablet daily until all taken (6,5,4,3,2,1)  Dispense: 21 tablet; Refill: 0  3. Acute maxillary sinusitis, recurrence not specified No pain or purulent drainage/mucuc today. Continue present regimen. Finished the Augmentin and continue allergy treatment. Recheck prn.  Dortha Kern, PA  [facility]

## 2015-11-26 ENCOUNTER — Other Ambulatory Visit: Payer: Self-pay | Admitting: Family Medicine

## 2015-12-23 ENCOUNTER — Other Ambulatory Visit: Payer: Self-pay | Admitting: Family Medicine

## 2015-12-25 DIAGNOSIS — G4733 Obstructive sleep apnea (adult) (pediatric): Secondary | ICD-10-CM | POA: Diagnosis not present

## 2015-12-29 ENCOUNTER — Other Ambulatory Visit: Payer: Self-pay | Admitting: Family Medicine

## 2015-12-29 ENCOUNTER — Telehealth: Payer: Self-pay | Admitting: Family Medicine

## 2015-12-29 NOTE — Telephone Encounter (Signed)
Please call in modafinil

## 2015-12-29 NOTE — Telephone Encounter (Signed)
Pt states the Rx for modafinil (PROVIGIL) 200 MG tablet is going to be $1728.00 a month.  Pt is asking if she can switch to something else.  [facility] [place].  CB#[phone]/MW

## 2015-12-31 NOTE — Telephone Encounter (Signed)
Can you check with pharmacy and see if armdafinil 150mg  is more affordable.

## 2016-01-01 ENCOUNTER — Other Ambulatory Visit: Payer: Self-pay | Admitting: Family Medicine

## 2016-01-01 NOTE — Telephone Encounter (Signed)
The pharmacy says the only way they can tell if it is cheaper to have a rx sent and run it through her insurance. Thanks.

## 2016-01-01 NOTE — Telephone Encounter (Signed)
Please call in armdafinil 150mg  per day #30, rf x 5. It is controlled so cannot send electronically.

## 2016-01-01 NOTE — Telephone Encounter (Signed)
Med called into pharmacy. However when I called and spoke with the pt. The message that she was trying to get to us was completely different than the message that was sent to us from up front. The Provigil is costing the insurance company 1728.00, NOT the pt. Pt states that the Nuvigil works better and is half the price for [employer] insurance company, NOT her. Pt states that either one will cost her the same but she feels the Nuvigil works better for her. Pt is wanting her Nuvigil called back and have a PA done for that. She gave the number to call for a PA (per the insurance company). [phone]. I have also since called the pharmacy back and cancelled the armdafinil prescription. Thanks.

## 2016-01-08 ENCOUNTER — Other Ambulatory Visit: Payer: Self-pay | Admitting: Family Medicine

## 2016-01-08 DIAGNOSIS — I10 Essential (primary) hypertension: Secondary | ICD-10-CM

## 2016-01-08 NOTE — Telephone Encounter (Signed)
Please call in Nuvigil 

## 2016-01-09 ENCOUNTER — Other Ambulatory Visit: Payer: Self-pay | Admitting: Family Medicine

## 2016-01-22 ENCOUNTER — Other Ambulatory Visit: Payer: Self-pay | Admitting: Family Medicine

## 2016-01-22 DIAGNOSIS — M5417 Radiculopathy, lumbosacral region: Secondary | ICD-10-CM

## 2016-01-22 DIAGNOSIS — H109 Unspecified conjunctivitis: Secondary | ICD-10-CM

## 2016-01-22 MED ORDER — HYDROCODONE-ACETAMINOPHEN 7.5-325 MG PO TABS: 1.0000 | ORAL_TABLET | Freq: Four times a day (QID) | ORAL | 0 refills | Status: DC | PRN

## 2016-01-22 NOTE — Telephone Encounter (Signed)
Pt needs refill on her hydrocodone. 7.5/325.  Please call when ready to pick up  [phone]  Thanks  [name]

## 2016-01-25 NOTE — Telephone Encounter (Signed)
error 

## 2016-01-31 ENCOUNTER — Other Ambulatory Visit: Payer: Self-pay | Admitting: Family Medicine

## 2016-01-31 NOTE — Telephone Encounter (Signed)
Please call in HornickNuvigil

## 2016-02-01 NOTE — Telephone Encounter (Signed)
Rx called in to pharmacy. 

## 2016-02-12 ENCOUNTER — Other Ambulatory Visit: Payer: Self-pay | Admitting: Family Medicine

## 2016-02-12 DIAGNOSIS — M5417 Radiculopathy, lumbosacral region: Secondary | ICD-10-CM

## 2016-02-12 NOTE — Telephone Encounter (Signed)
Last refill 01/22/2016. Allene DillonEmily Drozdowski, CMA

## 2016-02-12 NOTE — Telephone Encounter (Signed)
Last refill 11/10/2015.

## 2016-02-12 NOTE — Telephone Encounter (Signed)
Pt contacted office for refill request on the following medications: CB#[phone]/MW    HYDROcodone-acetaminophen (NORCO) 7.5-325 MG tablet  modafinil (PROVIGIL) 200 MG tablet

## 2016-02-15 MED ORDER — HYDROCODONE-ACETAMINOPHEN 7.5-325 MG PO TABS: 1.0000 | ORAL_TABLET | Freq: Four times a day (QID) | ORAL | 0 refills | Status: DC | PRN

## 2016-02-19 ENCOUNTER — Other Ambulatory Visit: Payer: Self-pay | Admitting: Family Medicine

## 2016-02-23 ENCOUNTER — Ambulatory Visit: Payer: Medicare Other | Admitting: Family Medicine

## 2016-02-24 ENCOUNTER — Ambulatory Visit (INDEPENDENT_AMBULATORY_CARE_PROVIDER_SITE_OTHER): Payer: Medicare Other | Admitting: Family Medicine

## 2016-02-24 ENCOUNTER — Encounter: Payer: Self-pay | Admitting: Family Medicine

## 2016-02-24 VITALS — BP 120/60 | HR 60 | Temp 98.4°F | Resp 16 | Wt 211.0 lb

## 2016-02-24 DIAGNOSIS — G4733 Obstructive sleep apnea (adult) (pediatric): Secondary | ICD-10-CM | POA: Diagnosis not present

## 2016-02-24 DIAGNOSIS — G4710 Hypersomnia, unspecified: Secondary | ICD-10-CM | POA: Diagnosis not present

## 2016-02-24 MED ORDER — NUVIGIL 250 MG PO TABS: 250.0000 mg | ORAL_TABLET | Freq: Every day | ORAL | 5 refills | Status: DC

## 2016-02-24 NOTE — Patient Instructions (Signed)
. .  I recommend that you get the Prevnar-13 vaccine to protect yourself from certain strains of pneumonia. Please call our office at [phone] at your earliest convenience to schedule this vaccine.    

## 2016-02-24 NOTE — Progress Notes (Signed)
Patient: Morgan Lynch Female    DOB: [birth year]   70 y.o.   MRN: [id number] Visit Date: 02/24/2016  Today's Provider: Mila Merry, MD   Chief Complaint  Patient presents with  . Follow-up    hypersomnia and apnea   Subjective:    HPI  Follow up for hypersomnia and Obstructive sleep apnea  The patient was last seen for this 9 months ago. Changes made at last visit include changed Nugil to Provigil .  She reports excellent compliance with treatment. She feels that condition is Unchanged. She is not having side effects.   She has sleep apnea and states that she is using her CPAP every night consistently, but still feels very sleepy during the day. She states that Nuvigil had been working well, but was not covered by insurance and had to change to Provigil which isn't helping at all. She had taken Ritalin 20mg  BID for several years in the past which she states worked very well.  ------------------------------------------------------------------------------------       Allergies  Allergen Reactions  . Oysters  [Shellfish Allergy]   . Simvastatin     Muscle Aches     Current Outpatient Prescriptions:  .  aspirin 81 MG tablet, Take 81 mg by mouth daily. , Disp: , Rfl:  .  atenolol-chlorthalidone (TENORETIC) 50-25 MG tablet, TAKE 1/2 TABLET BY MOUTH EVERY DAY, Disp: 30 tablet, Rfl: 5 .  EPIPEN 2-PAK 0.3 MG/0.3ML SOAJ injection, USE AS DIRECTED, Disp: 2 Device, Rfl: 3 .  FLUoxetine (PROZAC) 20 MG capsule, TAKE 3 CAPSULES BY MOUTH EVERY DAY, Disp: 270 capsule, Rfl: 3 .  fluticasone (FLONASE) 50 MCG/ACT nasal spray, USE 2 SPRAYS IN EACH NOSTRIL EVERY NIGHT AT BEDTIME, Disp: 48 g, Rfl: 1 .  HYDROcodone-acetaminophen (NORCO) 7.5-325 MG tablet, Take 1 tablet by mouth every 6 (six) hours as needed for moderate pain., Disp: 120 tablet, Rfl: 0 .  ipratropium (ATROVENT) 0.06 % nasal spray, INHALE 2 SPRAYS IN EACH NOSTRIL THREE TIMES DAILY, Disp: 15 mL, Rfl: 12 .  modafinil  (PROVIGIL) 200 MG tablet, Take 2 tablets (400 mg total) by mouth daily., Disp: 60 tablet, Rfl: 5 .  montelukast (SINGULAIR) 10 MG tablet, TAKE 1 TABLET BY MOUTH EVERY DAY, Disp: 90 tablet, Rfl: 4 .  naproxen (NAPROSYN) 500 MG tablet, TAKE 1 TABLET BY MOUTH TWICE DAILY AS NEEDED, Disp: 180 tablet, Rfl: 1 .  nystatin (MYCOSTATIN) 100000 UNIT/ML suspension, RINSE WITH 4 ML THOROUGHLY AND SPIT OUT FOUR TIMES DAILY, Disp: 180 mL, Rfl: 1 .  omeprazole (PRILOSEC) 20 MG capsule, Take 1 capsule (20 mg total) by mouth daily., Disp: 90 capsule, Rfl: 3 .  PRESCRIPTION MEDICATION, CPAP every night, Disp: , Rfl:  .  PROAIR HFA 108 (90 Base) MCG/ACT inhaler, USE 2 PUFFS BY INHALATION EVERY 6 HOURS AS NEEDED, Disp: 8.5 g, Rfl: 5 .  SYNTHROID 150 MCG tablet, TAKE 1 TABLET(150 MCG) BY MOUTH DAILY BEFORE BREAKFAST, Disp: 30 tablet, Rfl: 6 .  tiZANidine (ZANAFLEX) 4 MG tablet, TAKE 1 TABLET BY MOUTH EVERY 8 HOURS AS NEEDED, Disp: 270 tablet, Rfl: 1 .  topiramate (TOPAMAX) 100 MG tablet, TAKE 1 TABLET BY MOUTH TWICE DAILY, Disp: 180 tablet, Rfl: 0 .  triamcinolone cream (KENALOG) 0.5 %, APPLY TOPICALLY UP TO THREE TIMES DAILY AS NEEDED, Disp: 30 g, Rfl: 0 .  Vitamin D, Ergocalciferol, (DRISDOL) 50000 UNITS CAPS capsule, Take 50,000 Units by mouth every 7 (seven) days. , Disp: , Rfl:  Review of Systems  Constitutional: Positive for fatigue. Negative for chills, diaphoresis and fever.  Respiratory: Negative for chest tightness, shortness of breath and wheezing.   Cardiovascular: Negative for chest pain and leg swelling.    Social History  Substance Use Topics  . Smoking status: Former Smoker    Packs/day: 0.50    Years: 8.00    Types: Cigarettes  . Smokeless tobacco: Never Used  . Alcohol use No   Objective:   BP 120/60 (BP Location: Left Arm, Patient Position: Sitting, Cuff Size: Large)   Pulse 60   Temp 98.4 F (36.9 C) (Oral)   Resp 16   Wt 211 lb (95.7 kg)   SpO2 98%   BMI 39.87 kg/m   Physical  Exam  General Appearance:    Alert, cooperative, no distress, obese  Eyes:    PERRL, conjunctiva/corneas clear, EOM's intact       Lungs:     Clear to auscultation bilaterally, respirations unlabored  Heart:    Regular rate and rhythm  Neurologic:   Awake, alert, oriented x 3. No apparent focal neurological           defect.            Assessment & Plan:     1. Hypersomnia Worse since change from Nuvigil to Provigil due to insurance formulary. Have sent new rx for Nuvigil to pharmacy and will see if we can get prior authorization.  She would like to go back on Ritalin if Nuvigil cannot get covered. She was extensively counseled on cardiac risk of stimulant medications, but feels her health is at greater risk for falling asleep so readily during the daytime.   2. Obstructive sleep apnea She reports she is using CPAP consistently and is working well.   Advised of benefits of flu vaccine and Prevnar-13 which she refused to get today.       Mila Merryonald Fisher, MD  [facility]

## 2016-02-26 ENCOUNTER — Other Ambulatory Visit: Payer: Self-pay | Admitting: Family Medicine

## 2016-02-26 MED ORDER — NUVIGIL 250 MG PO TABS: 250.0000 mg | ORAL_TABLET | Freq: Every day | ORAL | 5 refills | Status: DC

## 2016-02-26 NOTE — Telephone Encounter (Signed)
Please advise 

## 2016-02-26 NOTE — Telephone Encounter (Signed)
RX called in; pt advised.  Thanks,  -Ganesh Deeg  

## 2016-02-26 NOTE — Telephone Encounter (Signed)
Please call in Nuvigil to [facility] [place]. Thanks.

## 2016-02-26 NOTE — Telephone Encounter (Signed)
Pt stated that she was in the office on 02/24/16 and was advised to try taking NUVIGIL 250 MG tablet and if that didn't work she could go back to Ritalin 20 mg twice a day. Pt stated that [facility] [place] advised her that they haven't received an RX for NUVIGIL 250 MG tablet and it would require a prior authorization once they got the RX. Pt stated that she doesn't know what is going on and since she knows the Ritalin has worked in the past she would like that called in today b/c that will also require a prior auth. Please advise. Thanks TNP

## 2016-03-01 ENCOUNTER — Other Ambulatory Visit: Payer: Self-pay | Admitting: Family Medicine

## 2016-03-08 ENCOUNTER — Other Ambulatory Visit: Payer: Self-pay | Admitting: Family Medicine

## 2016-03-08 DIAGNOSIS — M5417 Radiculopathy, lumbosacral region: Secondary | ICD-10-CM

## 2016-03-08 MED ORDER — HYDROCODONE-ACETAMINOPHEN 7.5-325 MG PO TABS: 1.0000 | ORAL_TABLET | Freq: Four times a day (QID) | ORAL | 0 refills | Status: DC | PRN

## 2016-03-26 ENCOUNTER — Other Ambulatory Visit: Payer: Self-pay | Admitting: Family Medicine

## 2016-04-01 ENCOUNTER — Other Ambulatory Visit: Payer: Self-pay | Admitting: Family Medicine

## 2016-04-01 DIAGNOSIS — M5417 Radiculopathy, lumbosacral region: Secondary | ICD-10-CM

## 2016-04-01 NOTE — Telephone Encounter (Signed)
Pt contacted office for refill request on the following medications: HYDROcodone-acetaminophen (NORCO) 7.5-325 MG tablet Last Written: 03/08/16 Last OV: 02/24/16 Please advise. Thanks TNP

## 2016-04-19 ENCOUNTER — Ambulatory Visit: Payer: Medicare Other

## 2016-04-22 ENCOUNTER — Other Ambulatory Visit: Payer: Self-pay | Admitting: Family Medicine

## 2016-04-23 ENCOUNTER — Other Ambulatory Visit: Payer: Self-pay | Admitting: Family Medicine

## 2016-04-25 ENCOUNTER — Other Ambulatory Visit: Payer: Self-pay | Admitting: Family Medicine

## 2016-05-16 ENCOUNTER — Other Ambulatory Visit: Payer: Self-pay | Admitting: Family Medicine

## 2016-05-20 ENCOUNTER — Telehealth: Payer: Self-pay | Admitting: *Deleted

## 2016-05-20 ENCOUNTER — Ambulatory Visit (INDEPENDENT_AMBULATORY_CARE_PROVIDER_SITE_OTHER): Payer: Medicare Other | Admitting: Family Medicine

## 2016-05-20 ENCOUNTER — Encounter: Payer: Self-pay | Admitting: Family Medicine

## 2016-05-20 DIAGNOSIS — F430 Acute stress reaction: Secondary | ICD-10-CM | POA: Insufficient documentation

## 2016-05-20 DIAGNOSIS — G510 Bell's palsy: Secondary | ICD-10-CM | POA: Insufficient documentation

## 2016-05-20 NOTE — Telephone Encounter (Signed)
Morgan Lynch health nurse advisor called office concerning a conversation she had with pt this morning. Patient had told Morgan Lynch she thought she had possibly had a light stroke. Called patient for more information. Patient stated that she had gotten upset yesterday and later in the evening felt that her face was pulling on the right side. Patient stated that her right eye and right side of her mouth was dropping slightly. Patient had no other symptoms besides aniexty. No symptoms of headache, dizziness, weakness in limbs, change in speech, or vision changes. Advised pt to go to ER to be evaluated  however patient refused and was scheduled for ov with Fisher today at 4:15 pm. Okay per Dr. Sherrie MustacheFisher.

## 2016-05-20 NOTE — Progress Notes (Signed)
Patient: Morgan Lynch Female    DOB: February 25, 1946   70 y.o.   MRN: [id number] Visit Date: 05/20/2016  Today's Provider: Mila Merryonald Shantoya Geurts, MD   Chief Complaint  Patient presents with  . Facial Droop   Subjective:    HPI Patient presents today extremely anxious about legal dispute regarding her home. She states that she has summoned to court on Monday 05/23/2016, but is unable to compose herself. She states she is so stressed that it is causing a 'mini-stroke' She states she woke up this morning with the right side of her face and mouth drooping. She has also had blurry vision off and on affecting only her right eye. Patient had had no other symptoms besides aniexty. No symptoms of headache, dizziness, weakness in limbs, change in speech. She has had same symptoms on several occasions in the passed, having had MRIs and CT scans and was told by neurologist that she had 'mini-strokes'  She had carotid dopplers in 2014 with no HD significant stenosis.  She had MRI at that time showing old lacunar infarct and severe sinusitis, but no acute CVA.       Allergies  Allergen Reactions  . Oysters  [Shellfish Allergy]   . Simvastatin     Muscle Aches     Current Outpatient Prescriptions:  .  aspirin 81 MG tablet, Take 81 mg by mouth daily. , Disp: , Rfl:  .  atenolol-chlorthalidone (TENORETIC) 50-25 MG tablet, TAKE 1/2 TABLET BY MOUTH EVERY DAY, Disp: 30 tablet, Rfl: 5 .  EPIPEN 2-PAK 0.3 MG/0.3ML SOAJ injection, USE AS DIRECTED, Disp: 2 Device, Rfl: 3 .  FLUoxetine (PROZAC) 20 MG capsule, TAKE 3 CAPSULES BY MOUTH EVERY DAY, Disp: 270 capsule, Rfl: 3 .  fluticasone (FLONASE) 50 MCG/ACT nasal spray, SHAKE LIQUID AND USE 2 SPRAYS IN EACH NOSTRIL EVERY NIGHT AT BEDTIME, Disp: 16 g, Rfl: 5 .  HYDROcodone-acetaminophen (NORCO) 7.5-325 MG tablet, Take 1 tablet by mouth every 6 (six) hours as needed for moderate pain., Disp: 120 tablet, Rfl: 0 .  ipratropium (ATROVENT) 0.06 % nasal spray, INHALE  2 SPRAYS IN EACH NOSTRIL THREE TIMES DAILY, Disp: 15 mL, Rfl: 12 .  montelukast (SINGULAIR) 10 MG tablet, TAKE 1 TABLET BY MOUTH DAILY, Disp: 30 tablet, Rfl: 11 .  naproxen (NAPROSYN) 500 MG tablet, TAKE 1 TABLET BY MOUTH TWICE DAILY AS NEEDED, Disp: 180 tablet, Rfl: 0 .  NUVIGIL 250 MG tablet, Take 1 tablet (250 mg total) by mouth daily., Disp: 30 tablet, Rfl: 5 .  nystatin (MYCOSTATIN) 100000 UNIT/ML suspension, RINSE WITH 4 ML THOROUGHLY AND SPIT OUT FOUR TIMES DAILY, Disp: 180 mL, Rfl: 1 .  omeprazole (PRILOSEC) 20 MG capsule, TAKE 1 CAPSULE BY MOUTH DAILY, Disp: 90 capsule, Rfl: 4 .  PRESCRIPTION MEDICATION, CPAP every night, Disp: , Rfl:  .  PROAIR HFA 108 (90 Base) MCG/ACT inhaler, USE 2 PUFFS BY INHALATION EVERY 6 HOURS AS NEEDED, Disp: 8.5 g, Rfl: 6 .  SYNTHROID 150 MCG tablet, TAKE 1 TABLET(150 MCG) BY MOUTH DAILY BEFORE BREAKFAST, Disp: 30 tablet, Rfl: 6 .  tiZANidine (ZANAFLEX) 4 MG tablet, TAKE 1 TABLET BY MOUTH EVERY 8 HOURS AS NEEDED, Disp: 270 tablet, Rfl: 1 .  topiramate (TOPAMAX) 100 MG tablet, TAKE 1 TABLET BY MOUTH TWICE DAILY, Disp: 180 tablet, Rfl: 3 .  triamcinolone cream (KENALOG) 0.5 %, APPLY TOPICALLY UP TO THREE TIMES DAILY AS NEEDED, Disp: 30 g, Rfl: 0 .  Vitamin D, Ergocalciferol, (DRISDOL)  50000 UNITS CAPS capsule, Take 50,000 Units by mouth every 7 (seven) days. , Disp: , Rfl:   Review of Systems  Constitutional: Negative for appetite change, chills, fatigue and fever.  Eyes: Positive for visual disturbance.  Respiratory: Negative for chest tightness and shortness of breath.   Cardiovascular: Negative for chest pain and palpitations.  Gastrointestinal: Negative for abdominal pain, nausea and vomiting.  Neurological: Negative for dizziness and weakness.    Social History  Substance Use Topics  . Smoking status: Former Smoker    Packs/day: 0.50    Years: 8.00    Types: Cigarettes  . Smokeless tobacco: Never Used  . Alcohol use No   Objective:   BP 118/70  (BP Location: Right Arm, Patient Position: Sitting, Cuff Size: Large)   Pulse 69   Temp 98.2 F (36.8 C) (Oral)   Resp 18   Wt 209 lb (94.8 kg)   SpO2 96%   BMI 39.49 kg/m   Physical Exam  General appearance: alert, well developed, well nourished, cooperative very anxious.  Head: Normocephalic, without obvious abnormality, atraumatic Respiratory: Respirations even and unlabored, normal respiratory rate Extremities: No gross deformities Skin: Skin color, texture, turgor normal. No rashes seen  Psych: Appropriate mood and affect. Neurologic: Mental status: Alert, oriented to person, place, and time, thought content appropriate. Normal MS of both UEs and both LEs. Mild upper and lower fascial asymmetry with decrease movement of right side of mouth, eyelids, and forehead. Slightly diminished somato-sensation of right side of face. No difficulty with speech or articulation.      Assessment & Plan:     1. Right-sided Bell's palsy She reports identical symptoms in the past which resolved without treatment. Exam today Is not consistent with CNS process. She only came in today because she feels the stress is too much for her to go to court on Monday. Counseled on signs of stroke or CVA and advised to go to ER if any develop of if symptoms progress.   2. Reaction, situational, acute, to stress Note written that patient is currently unable to attend court due to her medical condition.        Mila Merryonald Jusiah Aguayo, MD  [facility]

## 2016-05-23 ENCOUNTER — Telehealth: Payer: Self-pay | Admitting: Family Medicine

## 2016-05-23 ENCOUNTER — Other Ambulatory Visit: Payer: Self-pay | Admitting: Family Medicine

## 2016-05-23 NOTE — Telephone Encounter (Signed)
Left message to call back  

## 2016-05-23 NOTE — Telephone Encounter (Signed)
Pt request a note sent back stating she does not feel like she has bells palsy.  Pt states she had the same condition last year and she had a stoke.  CB#[phone]/MW

## 2016-05-23 NOTE — Telephone Encounter (Signed)
Her exam was consistent with Bell's Palsy, not a stroke. I would never let someone leave the office who is having a stroke.  If she would like we can order an MRI of her brain to see  which it was.

## 2016-05-23 NOTE — Telephone Encounter (Signed)
Pt calling saying she needs Dr. Sherrie MustacheFisher to write a brief description of her "condition" so she can be excused from court.  Her call back is [phone]  Thanks, Barth Kirkseri

## 2016-05-23 NOTE — Telephone Encounter (Signed)
Please advise 

## 2016-05-24 NOTE — Telephone Encounter (Signed)
Advised patient as below. Patient reports that her symptoms were possibly related to a mini stroke. She reports that she has had several of these since 2000. I advised the patient that you recommended that we could do a MRI of her brain to see which it was, and patient declined. Patient reports that her face is already back to normal. She reports that mini strokes comes and goes, and she gets them when she is very stressed. Patient is still requesting a note about her "condition" and for us to call when ready to pick up. Thanks!

## 2016-05-25 ENCOUNTER — Ambulatory Visit (INDEPENDENT_AMBULATORY_CARE_PROVIDER_SITE_OTHER): Payer: Medicare Other | Admitting: Family Medicine

## 2016-05-25 ENCOUNTER — Telehealth: Payer: Self-pay

## 2016-05-25 ENCOUNTER — Encounter: Payer: Self-pay | Admitting: Family Medicine

## 2016-05-25 VITALS — BP 120/64 | HR 59 | Temp 97.8°F | Resp 16 | Wt 208.0 lb

## 2016-05-25 DIAGNOSIS — M5417 Radiculopathy, lumbosacral region: Secondary | ICD-10-CM | POA: Diagnosis not present

## 2016-05-25 DIAGNOSIS — R29818 Other symptoms and signs involving the nervous system: Secondary | ICD-10-CM | POA: Diagnosis not present

## 2016-05-25 DIAGNOSIS — F439 Reaction to severe stress, unspecified: Secondary | ICD-10-CM

## 2016-05-25 MED ORDER — HYDROCODONE-ACETAMINOPHEN 7.5-325 MG PO TABS: 1.0000 | ORAL_TABLET | Freq: Four times a day (QID) | ORAL | 0 refills | Status: DC | PRN

## 2016-05-25 NOTE — Telephone Encounter (Signed)
Patient called back after being seen this morning requesting that her note be ready today. She contacted her lawyer who informed her that the note had to be submitted today in order for them to give her a continuance from court. Patient states that the note can be hand written.
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
## 2016-05-28 ENCOUNTER — Other Ambulatory Visit: Payer: Self-pay | Admitting: Family Medicine

## 2016-05-29 ENCOUNTER — Other Ambulatory Visit: Payer: Self-pay | Admitting: Family Medicine

## 2016-07-01 ENCOUNTER — Encounter: Payer: Self-pay | Admitting: Family Medicine

## 2016-07-01 ENCOUNTER — Ambulatory Visit (INDEPENDENT_AMBULATORY_CARE_PROVIDER_SITE_OTHER): Payer: Medicare Other | Admitting: Family Medicine

## 2016-07-01 VITALS — BP 120/64 | HR 68 | Temp 98.1°F | Resp 16 | Wt 210.0 lb

## 2016-07-01 DIAGNOSIS — F439 Reaction to severe stress, unspecified: Secondary | ICD-10-CM

## 2016-07-01 DIAGNOSIS — R29818 Other symptoms and signs involving the nervous system: Secondary | ICD-10-CM

## 2016-07-01 DIAGNOSIS — G4710 Hypersomnia, unspecified: Secondary | ICD-10-CM
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
## 2016-07-08 ENCOUNTER — Telehealth: Payer: Self-pay | Admitting: Family Medicine

## 2016-07-08 DIAGNOSIS — M5417 Radiculopathy, lumbosacral region: Secondary | ICD-10-CM

## 2016-07-08 MED ORDER — HYDROCODONE-ACETAMINOPHEN 7.5-325 MG PO TABS: 1.0000 | ORAL_TABLET | Freq: Four times a day (QID) | ORAL | 0 refills | Status: DC | PRN

## 2016-07-08 NOTE — Telephone Encounter (Signed)
Last fill 05/25/16, please review-aa

## 2016-07-08 NOTE — Telephone Encounter (Signed)
Pt contacted office for refill request on the following medications:  HYDROcodone-acetaminophen (NORCO) 7.5-325 MG tablet.  CB#[phone]/MW

## 2016-07-19 ENCOUNTER — Telehealth: Payer: Self-pay | Admitting: Family Medicine

## 2016-07-19 NOTE — Telephone Encounter (Signed)
Called Pt to schedule AWV with [facility] - knb °

## 2016-07-21 DIAGNOSIS — E039 Hypothyroidism, unspecified: Secondary | ICD-10-CM | POA: Diagnosis not present

## 2016-07-22 ENCOUNTER — Other Ambulatory Visit: Payer: Self-pay | Admitting: Family Medicine

## 2016-07-22 DIAGNOSIS — E039 Hypothyroidism, unspecified: Secondary | ICD-10-CM | POA: Diagnosis not present

## 2016-07-22 DIAGNOSIS — R5382 Chronic fatigue, unspecified: Secondary | ICD-10-CM | POA: Diagnosis not present

## 2016-07-26 ENCOUNTER — Other Ambulatory Visit: Payer: Self-pay | Admitting: Family Medicine

## 2016-07-27 NOTE — Telephone Encounter (Signed)
Please call in Provigil 

## 2016-07-27 NOTE — Telephone Encounter (Signed)
RX called in at [facility] pharmacy  

## 2016-08-05 ENCOUNTER — Ambulatory Visit: Payer: Self-pay | Admitting: Family Medicine

## 2016-08-08 ENCOUNTER — Ambulatory Visit: Payer: Self-pay | Admitting: Family Medicine

## 2016-08-15 ENCOUNTER — Other Ambulatory Visit: Payer: Self-pay | Admitting: Family Medicine

## 2016-08-15 DIAGNOSIS — M5417 Radiculopathy, lumbosacral region: Secondary | ICD-10-CM

## 2016-08-15 NOTE — Telephone Encounter (Signed)
Pt contacted office for refill request on the following medications: HYDROcodone-acetaminophen (NORCO) 7.5-325 MG tablet  Last Rx: 07/08/16 Please advise. Thanks TNP

## 2016-08-16 MED ORDER — HYDROCODONE-ACETAMINOPHEN 7.5-325 MG PO TABS: 1.0000 | ORAL_TABLET | Freq: Four times a day (QID) | ORAL | 0 refills | Status: DC | PRN

## 2016-08-17 ENCOUNTER — Telehealth: Payer: Self-pay

## 2016-08-17 NOTE — Telephone Encounter (Signed)
Dr Sherrie MustacheFisher I did not see any letters in her chart. Are you aware of a letter or working on it now?-aa

## 2016-08-17 NOTE — Telephone Encounter (Signed)
I wrote a couple letters over the last couple of months. Does she need a copy of one of them? Which one?

## 2016-08-17 NOTE — Telephone Encounter (Signed)
Called patient but voicemail is full and does not except new patients at this time-aa

## 2016-08-17 NOTE — Telephone Encounter (Signed)
Patient is requesting a copy of the letter Dr. Sherrie MustacheFisher wrote her for court to give to her attorney.   CB#[phone]

## 2016-08-25 DIAGNOSIS — R2990 Unspecified symptoms and signs involving the nervous system: Secondary | ICD-10-CM | POA: Diagnosis not present

## 2016-08-25 DIAGNOSIS — G510 Bell's palsy: Secondary | ICD-10-CM | POA: Diagnosis not present

## 2016-08-30 ENCOUNTER — Other Ambulatory Visit: Payer: Self-pay | Admitting: Neurology

## 2016-08-30 DIAGNOSIS — R2990 Unspecified symptoms and signs involving the nervous system: Secondary | ICD-10-CM

## 2016-09-03 ENCOUNTER — Other Ambulatory Visit: Payer: Self-pay | Admitting: Family Medicine

## 2016-09-05 NOTE — Telephone Encounter (Signed)
Rx called in to pharmacy. 

## 2016-09-05 NOTE — Telephone Encounter (Signed)
Please call in armodafinil

## 2016-09-08 ENCOUNTER — Other Ambulatory Visit
Admission: RE | Admit: 2016-09-08 | Discharge: 2016-09-08 | Disposition: A | Discharge status: Home / Self Care | Payer: Medicare Other | Source: Ambulatory Visit | Attending: Neurology | Admitting: Neurology

## 2016-09-08 ENCOUNTER — Ambulatory Visit
Admission: RE | Admit: 2016-09-08 | Discharge: 2016-09-08 | Disposition: A | Discharge status: Home / Self Care | Payer: Medicare Other | Source: Ambulatory Visit | Attending: Neurology | Admitting: Neurology

## 2016-09-08 DIAGNOSIS — R2990 Unspecified symptoms and signs involving the nervous system: Secondary | ICD-10-CM | POA: Insufficient documentation

## 2016-09-08 DIAGNOSIS — R600 Localized edema: Secondary | ICD-10-CM | POA: Diagnosis not present

## 2016-09-08 DIAGNOSIS — R51 Headache: Secondary | ICD-10-CM | POA: Diagnosis not present

## 2016-09-08 DIAGNOSIS — M5031 Other cervical disc degeneration,  high cervical region: Secondary | ICD-10-CM | POA: Diagnosis not present

## 2016-09-08 DIAGNOSIS — R42 Dizziness and giddiness: Secondary | ICD-10-CM | POA: Diagnosis not present

## 2016-09-08 DIAGNOSIS — M4802 Spinal stenosis, cervical region: Secondary | ICD-10-CM | POA: Insufficient documentation

## 2016-09-08 LAB — CREATININE, SERUM
Creatinine, Ser: 1.35 mg/dL — ABNORMAL HIGH (ref 0.44–1.00)
GFR calc Af Amer: 45 mL/min — ABNORMAL LOW (ref >60)
GFR, EST NON AFRICAN AMERICAN: 38 mL/min — AB (ref >60)

## 2016-09-08 LAB — BUN: BUN: 25 mg/dL — ABNORMAL HIGH (ref 6–20)

## 2016-09-08 MED ORDER — GADOBENATE DIMEGLUMINE 529 MG/ML IV SOLN
10.0000 mL | Freq: Once | INTRAVENOUS | Status: AC | PRN
  Administered 2016-09-08: 10 mL via INTRAVENOUS

## 2016-09-15 ENCOUNTER — Telehealth: Payer: Self-pay | Admitting: Family Medicine

## 2016-09-15 ENCOUNTER — Other Ambulatory Visit: Payer: Self-pay | Admitting: Family Medicine

## 2016-09-15 DIAGNOSIS — M5417 Radiculopathy, lumbosacral region: Secondary | ICD-10-CM

## 2016-09-15 MED ORDER — HYDROCODONE-ACETAMINOPHEN 7.5-325 MG PO TABS: 1.0000 | ORAL_TABLET | Freq: Four times a day (QID) | ORAL | 0 refills | Status: DC | PRN

## 2016-09-15 NOTE — Telephone Encounter (Signed)
Please review-aa 

## 2016-09-15 NOTE — Telephone Encounter (Addendum)
Pt contacted office for refill request on the following medications:  HYDROcodone-acetaminophen (NORCO) 7.5-325 MG tablet.  CB#[phone]/MW  This is a Dr Sherrie MustacheFisher pt.  Pt states she has also seen Dennis/MW

## 2016-09-15 NOTE — Telephone Encounter (Signed)
Printed prescription for pick up at the front desk since her last 30 day refill was on 08-16-16. Must keep follow up appointments with Dr. Sherrie MustacheFisher and next refill will be from him ONLY.

## 2016-09-19 NOTE — Telephone Encounter (Signed)
Pt advised-aa 

## 2016-09-22 ENCOUNTER — Other Ambulatory Visit: Payer: Self-pay | Admitting: Neurosurgery

## 2016-09-22 DIAGNOSIS — G959 Disease of spinal cord, unspecified: Secondary | ICD-10-CM

## 2016-09-22 DIAGNOSIS — M4712 Other spondylosis with myelopathy, cervical region: Secondary | ICD-10-CM | POA: Diagnosis not present

## 2016-09-22 DIAGNOSIS — M542 Cervicalgia: Secondary | ICD-10-CM | POA: Diagnosis not present

## 2016-10-04 ENCOUNTER — Ambulatory Visit
Admission: RE | Admit: 2016-10-04 | Discharge: 2016-10-04 | Disposition: A | Discharge status: Home / Self Care | Payer: Medicare Other | Source: Ambulatory Visit | Attending: Neurosurgery | Admitting: Neurosurgery

## 2016-10-04 DIAGNOSIS — M4712 Other spondylosis with myelopathy, cervical region: Secondary | ICD-10-CM

## 2016-10-04 DIAGNOSIS — S199XXA Unspecified injury of neck, initial encounter: Secondary | ICD-10-CM | POA: Diagnosis not present

## 2016-10-04 DIAGNOSIS — M5412 Radiculopathy, cervical region: Secondary | ICD-10-CM

## 2016-10-04 DIAGNOSIS — M50322 Other cervical disc degeneration at C5-C6 level: Secondary | ICD-10-CM | POA: Diagnosis not present

## 2016-10-04 DIAGNOSIS — M542 Cervicalgia: Secondary | ICD-10-CM | POA: Diagnosis not present

## 2016-10-06 DIAGNOSIS — M4712 Other spondylosis with myelopathy, cervical region: Secondary | ICD-10-CM | POA: Diagnosis not present

## 2016-10-17 ENCOUNTER — Other Ambulatory Visit: Payer: Self-pay | Admitting: Family Medicine

## 2016-10-17 DIAGNOSIS — M5417 Radiculopathy, lumbosacral region: Secondary | ICD-10-CM

## 2016-10-17 NOTE — Telephone Encounter (Signed)
Pt needs refill HYDROcodone-acetaminophen (NORCO) 7.5-325 MG tablet ° ° °Thanks Teri °

## 2016-10-18 MED ORDER — HYDROCODONE-ACETAMINOPHEN 7.5-325 MG PO TABS
1.0000 | ORAL_TABLET | Freq: Four times a day (QID) | ORAL | 0 refills | Status: DC | PRN
Start: 2016-10-18 — End: 2016-11-28

## 2016-10-25 DIAGNOSIS — M4712 Other spondylosis with myelopathy, cervical region: Secondary | ICD-10-CM | POA: Insufficient documentation

## 2016-11-08 ENCOUNTER — Telehealth: Payer: Self-pay | Admitting: Family Medicine

## 2016-11-08 NOTE — Telephone Encounter (Signed)
Patient contacted to schedule AWV w [facility]

## 2016-11-28 ENCOUNTER — Other Ambulatory Visit: Payer: Self-pay | Admitting: Family Medicine

## 2016-11-28 DIAGNOSIS — M5417 Radiculopathy, lumbosacral region: Secondary | ICD-10-CM

## 2016-11-28 MED ORDER — HYDROCODONE-ACETAMINOPHEN 7.5-325 MG PO TABS: 1.0000 | ORAL_TABLET | Freq: Four times a day (QID) | ORAL | 0 refills | Status: DC | PRN

## 2016-11-28 NOTE — Telephone Encounter (Signed)
Pt is requesting refill on HYDROcodone-acetaminophen (NORCO) 7.5-325 MG tablet

## 2017-01-10 ENCOUNTER — Other Ambulatory Visit: Payer: Self-pay | Admitting: Family Medicine

## 2017-01-18 ENCOUNTER — Other Ambulatory Visit: Payer: Self-pay | Admitting: Family Medicine

## 2017-01-18 DIAGNOSIS — M5417 Radiculopathy, lumbosacral region: Secondary | ICD-10-CM

## 2017-01-18 NOTE — Telephone Encounter (Signed)
Pt needs refill on   Hydrocodone 7.5-325  Thanks teri

## 2017-01-19 MED ORDER — HYDROCODONE-ACETAMINOPHEN 7.5-325 MG PO TABS: 1.0000 | ORAL_TABLET | Freq: Four times a day (QID) | ORAL | 0 refills | Status: DC | PRN

## 2017-01-31 ENCOUNTER — Encounter
Admission: EM | Disposition: A | Discharge status: Home / Self Care | Payer: Self-pay | Source: Home / Self Care | Attending: Obstetrics and Gynecology

## 2017-01-31 ENCOUNTER — Encounter: Payer: Self-pay | Admitting: Physician Assistant

## 2017-01-31 ENCOUNTER — Inpatient Hospital Stay
Admission: EM | Admit: 2017-01-31 | Discharge: 2017-02-03 | DRG: 747 | Disposition: A | Discharge status: Home / Self Care | Payer: Medicare Other | Attending: Obstetrics and Gynecology | Admitting: Obstetrics and Gynecology

## 2017-01-31 ENCOUNTER — Emergency Department: Payer: Medicare Other

## 2017-01-31 DIAGNOSIS — K219 Gastro-esophageal reflux disease without esophagitis: Secondary | ICD-10-CM | POA: Diagnosis present

## 2017-01-31 DIAGNOSIS — E039 Hypothyroidism, unspecified: Secondary | ICD-10-CM | POA: Diagnosis present

## 2017-01-31 DIAGNOSIS — Z87891 Personal history of nicotine dependence: Secondary | ICD-10-CM

## 2017-01-31 DIAGNOSIS — E785 Hyperlipidemia, unspecified: Secondary | ICD-10-CM | POA: Diagnosis present

## 2017-01-31 DIAGNOSIS — K5732 Diverticulitis of large intestine without perforation or abscess without bleeding: Secondary | ICD-10-CM | POA: Diagnosis not present

## 2017-01-31 DIAGNOSIS — I10 Essential (primary) hypertension: Secondary | ICD-10-CM | POA: Diagnosis present

## 2017-01-31 DIAGNOSIS — E119 Type 2 diabetes mellitus without complications: Secondary | ICD-10-CM | POA: Diagnosis present

## 2017-01-31 DIAGNOSIS — N764 Abscess of vulva: Principal | ICD-10-CM | POA: Diagnosis present

## 2017-01-31 DIAGNOSIS — F419 Anxiety disorder, unspecified: Secondary | ICD-10-CM | POA: Diagnosis present

## 2017-01-31 DIAGNOSIS — E876 Hypokalemia: Secondary | ICD-10-CM | POA: Diagnosis present

## 2017-01-31 DIAGNOSIS — B9562 Methicillin resistant Staphylococcus aureus infection as the cause of diseases classified elsewhere: Secondary | ICD-10-CM | POA: Diagnosis present

## 2017-01-31 DIAGNOSIS — I2510 Atherosclerotic heart disease of native coronary artery without angina pectoris: Secondary | ICD-10-CM | POA: Diagnosis not present

## 2017-01-31 LAB — CBC WITH DIFFERENTIAL/PLATELET
BASOS ABS: 0.1 10*3/uL (ref 0–0.1)
BASOS PCT: 0 %
Eosinophils Absolute: 0.3 10*3/uL (ref 0–0.7)
Eosinophils Relative: 1 %
HEMATOCRIT: 33.3 % — AB (ref 35.0–47.0)
HEMOGLOBIN: 11.1 g/dL — AB (ref 12.0–16.0)
Lymphocytes Relative: 12 %
Lymphs Abs: 2.5 10*3/uL (ref 1.0–3.6)
MCH: 30.1 pg (ref 26.0–34.0)
MCHC: 33.3 g/dL (ref 32.0–36.0)
MCV: 90.5 fL (ref 80.0–100.0)
Monocytes Absolute: 0.9 10*3/uL (ref 0.2–0.9)
Monocytes Relative: 5 %
NEUTROS ABS: 16.7 10*3/uL — AB (ref 1.4–6.5)
NEUTROS PCT: 82 %
Platelets: 400 10*3/uL (ref 150–440)
RBC: 3.68 MIL/uL — ABNORMAL LOW (ref 3.80–5.20)
RDW: 13.1 % (ref 11.5–14.5)
WBC: 20.6 10*3/uL — AB (ref 3.6–11.0)

## 2017-01-31 LAB — BASIC METABOLIC PANEL
ANION GAP: 9 (ref 5–15)
BUN: 11 mg/dL (ref 6–20)
CALCIUM: 8.7 mg/dL — AB (ref 8.9–10.3)
CO2: 23 mmol/L (ref 22–32)
Chloride: 108 mmol/L (ref 101–111)
Creatinine, Ser: 1.11 mg/dL — ABNORMAL HIGH (ref 0.44–1.00)
GFR, EST AFRICAN AMERICAN: 57 mL/min — AB (ref >60)
GFR, EST NON AFRICAN AMERICAN: 49 mL/min — AB (ref >60)
Glucose, Bld: 103 mg/dL — ABNORMAL HIGH (ref 65–99)
Potassium: 2.9 mmol/L — ABNORMAL LOW (ref 3.5–5.1)
Sodium: 140 mmol/L (ref 135–145)

## 2017-01-31 SURGERY — IRRIGATION AND DEBRIDEMENT HEMATOMA
Anesthesia: Choice

## 2017-01-31 MED ORDER — MORPHINE SULFATE (PF) 2 MG/ML IV SOLN
2.0000 mg | Freq: Once | INTRAVENOUS | Status: AC
  Administered 2017-01-31: 2 mg via INTRAVENOUS
  Filled 2017-01-31: qty 1

## 2017-01-31 MED ORDER — IOPAMIDOL (ISOVUE-300) INJECTION 61%
100.0000 mL | Freq: Once | INTRAVENOUS | Status: AC | PRN
  Administered 2017-01-31: 100 mL via INTRAVENOUS
  Filled 2017-01-31: qty 100

## 2017-01-31 MED ORDER — VANCOMYCIN HCL IN DEXTROSE 1-5 GM/200ML-% IV SOLN
1000.0000 mg | Freq: Two times a day (BID) | INTRAVENOUS | Status: DC
  Administered 2017-01-31 – 2017-02-03 (×6): 1000 mg via INTRAVENOUS
  Filled 2017-01-31 (×9): qty 200

## 2017-01-31 MED ORDER — POTASSIUM CHLORIDE CRYS ER 20 MEQ PO TBCR
20.0000 meq | EXTENDED_RELEASE_TABLET | ORAL | Status: AC
  Administered 2017-02-01 (×3): 20 meq via ORAL
  Filled 2017-01-31 (×3): qty 1

## 2017-01-31 MED ORDER — MORPHINE SULFATE (PF) 2 MG/ML IV SOLN
1.0000 mg | INTRAVENOUS | Status: DC | PRN
  Administered 2017-01-31 – 2017-02-02 (×5): 1 mg via INTRAVENOUS
  Filled 2017-01-31 (×5): qty 1

## 2017-01-31 MED ORDER — IOPAMIDOL (ISOVUE-300) INJECTION 61%
30.0000 mL | Freq: Once | INTRAVENOUS | Status: AC
  Administered 2017-01-31: 30 mL via ORAL
  Filled 2017-01-31: qty 30

## 2017-01-31 MED ORDER — KETOROLAC TROMETHAMINE 30 MG/ML IJ SOLN: 15.0000 mg | Freq: Once | INTRAMUSCULAR | Status: DC

## 2017-01-31 MED ORDER — POTASSIUM CHLORIDE 10 MEQ/100ML IV SOLN
10.0000 meq | INTRAVENOUS | Status: DC
  Administered 2017-01-31: 10 meq via INTRAVENOUS
  Filled 2017-01-31 (×4): qty 100

## 2017-01-31 MED ORDER — ONDANSETRON HCL 4 MG/2ML IJ SOLN
4.0000 mg | Freq: Once | INTRAMUSCULAR | Status: AC
  Administered 2017-01-31: 4 mg via INTRAVENOUS
  Filled 2017-01-31: qty 2

## 2017-01-31 MED ORDER — VANCOMYCIN HCL IN DEXTROSE 1-5 GM/200ML-% IV SOLN
INTRAVENOUS | Status: AC
  Filled 2017-01-31: qty 200

## 2017-01-31 MED ORDER — LACTATED RINGERS IV SOLN
INTRAVENOUS | Status: DC
  Administered 2017-01-31 – 2017-02-01 (×3): via INTRAVENOUS

## 2017-01-31 MED ORDER — PRENATAL MULTIVITAMIN CH
1.0000 | ORAL_TABLET | Freq: Every day | ORAL | Status: DC
  Administered 2017-02-01 – 2017-02-03 (×3): 1 via ORAL
  Filled 2017-01-31 (×3): qty 1

## 2017-01-31 MED ORDER — SODIUM CHLORIDE 0.9 % IV BOLUS (SEPSIS)
1000.0000 mL | Freq: Once | INTRAVENOUS | Status: AC
  Administered 2017-01-31: 1000 mL via INTRAVENOUS

## 2017-01-31 MED ORDER — POTASSIUM CHLORIDE 10 MEQ/100ML IV SOLN
10.0000 meq | INTRAVENOUS | Status: AC
  Filled 2017-01-31 (×2): qty 100

## 2017-01-31 SURGICAL SUPPLY — 24 items
BLADE SURG 15 STRL LF DISP TIS (BLADE) ×1 IMPLANT
BLADE SURG 15 STRL SS (BLADE) ×2
BLADE SURG SZ10 CARB STEEL (BLADE) ×3 IMPLANT
CATH ROBINSON RED A/P 16FR (CATHETERS) ×3 IMPLANT
DRAPE PERI LITHO V/GYN (MISCELLANEOUS) ×3 IMPLANT
DRAPE UNDER BUTTOCK W/FLU (DRAPES) ×3 IMPLANT
ELECT REM PT RETURN 9FT ADLT (ELECTROSURGICAL) ×3
ELECTRODE REM PT RTRN 9FT ADLT (ELECTROSURGICAL) ×1 IMPLANT
GLOVE BIO SURGEON STRL SZ8 (GLOVE) ×3 IMPLANT
GOWN STRL REUS W/ TWL LRG LVL3 (GOWN DISPOSABLE) ×1 IMPLANT
GOWN STRL REUS W/ TWL XL LVL3 (GOWN DISPOSABLE) ×1 IMPLANT
GOWN STRL REUS W/TWL LRG LVL3 (GOWN DISPOSABLE) ×2
GOWN STRL REUS W/TWL XL LVL3 (GOWN DISPOSABLE) ×2
KIT RM TURNOVER STRD PROC AR (KITS) ×3 IMPLANT
LABEL OR SOLS (LABEL) ×3 IMPLANT
NDL SAFETY 22GX1.5 (NEEDLE) ×3 IMPLANT
NS IRRIG 500ML POUR BTL (IV SOLUTION) ×3 IMPLANT
PACK BASIN MINOR ARMC (MISCELLANEOUS) ×3 IMPLANT
PAD OB MATERNITY 4.3X12.25 (PERSONAL CARE ITEMS) ×3 IMPLANT
PAD PREP 24X41 OB/GYN DISP (PERSONAL CARE ITEMS) ×3 IMPLANT
SPONGE XRAY 4X4 16PLY STRL (MISCELLANEOUS) ×3 IMPLANT
SUT VIC AB 4-0 FS2 27 (SUTURE) ×3 IMPLANT
SUT VICRYL AB 3-0 FS1 BRD 27IN (SUTURE) ×3 IMPLANT
SYR CONTROL 10ML (SYRINGE) ×3 IMPLANT

## 2017-01-31 NOTE — ED Provider Notes (Signed)
[facility] Emergency Department Provider Note ____________________________________________  Time seen: 1540  I have reviewed the triage vital signs and the nursing notes.  HISTORY  Chief Complaint  Abscess  HPI Morgan Lynch is a 71 y.o. female presents to the ED with complaints of a right upper thigh and vulvar abscess, with spontaneous drainage. She initially noted the area about 2 weeks ago. Since that time, she has been applying warm compresses, taking sitz baths, and attempting to squeeze the contents. She admits to getting it to drain purulent material, blood, and then noted what looked like "skin" coming out. She notes intermittent fevers in the beginning. She has been dosing Aleve for pain relief. Patient denies any current fevers, chills, or sweats in the interim.She denies previous abscess formation. She has a history of DDD, HTN, thyroid disease, hyperlipidemia, and OSA.  Past Medical History:  Diagnosis Date  . Anxiety   . Arthritis   . Cerebral artery occlusion with cerebral infarction (HCC)   . GERD (gastroesophageal reflux disease)   . History of chicken pox   . History of measles   . History of mumps   . Hyperlipidemia   . Hypertension   . OSA (obstructive sleep apnea)   . Osteopenia   . Thyroid disease     Patient Active Problem List   Diagnosis Date Noted  . Vulvar abscess 01/31/2017  . Right-sided Bell's palsy 05/20/2016  . Reaction, situational, acute, to stress 05/20/2016  . Adiposity 11/12/2015  . Fibromyalgia 10/29/2015  . Chronic pain 10/29/2015  . Clinical depression 10/29/2015  . Change in bowel function 03/04/2015  . Vitamin D deficiency 03/04/2015  . Obesity 03/04/2015  . Hypersomnia 03/04/2015  . Allergic rhinitis 03/04/2015  . GERD (gastroesophageal reflux disease) 03/04/2015  . Degenerative arthritis of spine 03/04/2015  . Facial numbness 03/04/2015  . Flatulence 03/04/2015  . Osteopenia 05/22/2014  . Malaise  and fatigue 02/23/2010  . Alopecia 12/09/2009  . Constipation 02/29/2008  . CAD (coronary artery disease) 05/09/2007  . Edema 03/22/2007  . DDD (degenerative disc disease), lumbar 12/09/2006  . Cerebral artery occlusion with cerebral infarction (HCC) 12/09/2006  . Anxiety state 11/01/2006  . Hypothyroidism 07/30/2006  . Hyperlipidemia 07/30/2006  . Obstructive sleep apnea 07/30/2006  . Hypertension 07/30/2006  . 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 History of colon polyps 07/30/2006  . Restless leg 06/20/2004  . Muscle ache 06/20/1998    Past Surgical History:  Procedure Laterality Date  . ABDOMINAL HYSTERECTOMY     unilateral OOP  . AUGMENTATION MAMMAPLASTY  1975 &1992   removal of implants in 1992  . CAROTID DOPPLER ULTRASOUND  01/04/2013   No hemodynamically significant stenosis  . CHOLECYSTECTOMY    . DOPPLER ECHOCARDIOGRAPHY  04/04/2013   mild MVR and TR. mild LVH  . MRI BRAIN,BRAIN STEM  01/04/2013   Chronic Ischemic changes. Old lacunar infarct. Severe sinusitis  . MYOCARDIAL PERFUSION SCAN  04/05/2013   Normal  . pulmonary function test  07/21/2010   Normal; Done by Dr. Welton Flakes  . SPINAL FUSION  1988   lower back  . TOTAL HIP ARTHROPLASTY Left 2006    Prior to Admission medications   Medication Sig Start Date End Date Taking? Authorizing Provider  Armodafinil 250 MG tablet TAKE 1 TABLET BY MOUTH EVERY DAY 09/05/16   Malva Limes, MD  aspirin 81 MG tablet Take 81 mg by mouth daily.  01/01/13   [provider]  atenolol-chlorthalidone (TENORETIC) 50-25 MG tablet TAKE 1/2  TABLET BY MOUTH EVERY DAY 01/08/16   Malva Limes, MD  EPINEPHRINE 0.3 mg/0.3 mL IJ SOAJ injection USE AS DIRECTED 05/29/16   Malva Limes, MD  FLUoxetine (PROZAC) 20 MG capsule TAKE 3 CAPSULES BY MOUTH EVERY DAY 12/23/15   Malva Limes, MD  fluticasone (FLONASE) 50 MCG/ACT nasal spray SHAKE LIQUID AND USE 2 SPRAYS IN EACH NOSTRIL EVERY NIGHT AT BEDTIME 05/23/16   Malva Limes,  MD  HYDROcodone-acetaminophen (NORCO) 7.5-325 MG tablet Take 1 tablet by mouth every 6 (six) hours as needed for moderate pain. 01/19/17   Malva Limes, MD  ipratropium (ATROVENT) 0.06 % nasal spray INHALE 2 SPRAYS IN [facility] NOSTRIL THREE TIMES DAILY 02/12/16   Malva Limes, MD  modafinil (PROVIGIL) 200 MG tablet TAKE 2 TABLETS BY MOUTH ONCE DAILY 07/27/16   Malva Limes, MD  montelukast (SINGULAIR) 10 MG tablet TAKE 1 TABLET BY MOUTH EVERY DAY 01/10/17   Malva Limes, MD  naproxen (NAPROSYN) 500 MG tablet TAKE 1 TABLET BY MOUTH TWICE DAILY AS NEEDED 07/22/16   Malva Limes, MD  nystatin (MYCOSTATIN) 100000 UNIT/ML suspension RINSE WITH 4 ML THOROUGHLY AND SPIT OUT FOUR TIMES DAILY 06/17/15   Malva Limes, MD  omeprazole (PRILOSEC) 20 MG capsule TAKE 1 CAPSULE BY MOUTH DAILY 04/23/16   Malva Limes, MD  PRESCRIPTION MEDICATION CPAP every night    [provider]  PROAIR HFA 108 [phone] Base) MCG/ACT inhaler USE 2 PUFFS BY INHALATION EVERY 6 HOURS AS NEEDED 04/25/16   Malva Limes, MD  SYNTHROID 150 MCG tablet TAKE 1 TABLET(150 MCG) BY MOUTH DAILY BEFORE BREAKFAST 09/28/15   Margaretann Loveless, PA-C  tiZANidine (ZANAFLEX) 4 MG tablet TAKE 1 TABLET BY MOUTH EVERY 8 HOURS AS NEEDED 05/28/16   Malva Limes, MD  topiramate (TOPAMAX) 100 MG tablet TAKE 1 TABLET BY MOUTH TWICE DAILY 05/16/16   Malva Limes, MD  triamcinolone cream (KENALOG) 0.5 % APPLY TOPICALLY UP TO THREE TIMES DAILY AS NEEDED 02/19/16   Malva Limes, MD  Vitamin D, Ergocalciferol, (DRISDOL) 50000 UNITS CAPS capsule Take 50,000 Units by mouth every 7 (seven) days.  05/23/14   [provider]    Allergies Oysters  [shellfish allergy] and Simvastatin  Family History  Problem Relation Age of Onset  . Pancreatic cancer Father   . Colon polyps Father   . Breast cancer Maternal Aunt     Social History Social History  Substance Use Topics  . Smoking status: Former Smoker    Packs/day:  0.50    Years: 8.00    Types: Cigarettes  . Smokeless tobacco: Never Used  . Alcohol use No    Review of Systems  Constitutional: Negative for fever. Cardiovascular: Negative for chest pain. Respiratory: Negative for shortness of breath. Gastrointestinal: Negative for abdominal pain, vomiting and diarrhea. Genitourinary: Negative for dysuria. Skin: Negative for rash. Large vulvar abcsess as above.  ____________________________________________  PHYSICAL EXAM:  VITAL SIGNS: ED Triage Vitals [01/31/17 1515]  Enc Vitals Group     BP 130/60     Pulse Rate 84     Resp 17     Temp 98.2 F (36.8 C)     Temp Source Oral     SpO2 97 %     Weight 200 lb (90.7 kg)     Height 5\' 1"  (1.549 m)     Head Circumference      Peak Flow  Pain Score 10     Pain Loc      Pain Edu?      Excl. in GC?     Constitutional: Alert and oriented. Well appearing and in no distress. Head: Normocephalic and atraumatic. Eyes: Conjunctivae are normal. Normal extraocular movements Cardiovascular: Normal rate, regular rhythm. Normal distal pulses. Respiratory: Normal respiratory effort. No wheezes/rales/rhonchi. Gastrointestinal: protuberant with a small panus. Soft and nontender. No distention, rebound, guarding, or rigidity. Normoactive bowel sounds.  GU: Normal external genitalia except for a large, firm, fluctuant consolidation involving the entire left labia and extending over the mons pubis. There is an open drainage site in the medial portion of the left labia as well as one at the inferior pole of the same labia. There is an area of pointing over the mons with erythema but without rupture or drainage.  Musculoskeletal: Nontender with normal range of motion in all extremities.  Neurologic:  Normal gait without ataxia. Normal speech and language. No gross focal neurologic deficits are appreciated. Skin:  Skin is warm, dry and intact. No rash noted. ____________________________________________    LABS (pertinent positives/negatives)  Labs Reviewed  BASIC METABOLIC PANEL - Abnormal; Notable for the following:       Result Value   Potassium 2.9 (*)    Glucose, Bld 103 (*)    Creatinine, Ser 1.11 (*)    Calcium 8.7 (*)    GFR calc non Af Amer 49 (*)    GFR calc Af Amer 57 (*)    All other components within normal limits  CBC WITH DIFFERENTIAL/PLATELET - Abnormal; Notable for the following:    WBC 20.6 (*)    RBC 3.68 (*)    Hemoglobin 11.1 (*)    HCT 33.3 (*)    Neutro Abs 16.7 (*)    All other components within normal limits  AEROBIC CULTURE (SUPERFICIAL SPECIMEN)  CBC WITH DIFFERENTIAL/PLATELET  ____________________________________________   RADIOLOGY  CT Abd/Pelvis w/ CM  IMPRESSION: 1. Abnormal collection of fluid suggesting abscess and phlegmon along the left labium majus, left perineum, and extending along the left pubis. This is not observed to extend into the pelvis. In some patients, especially in the setting of diabetes or immunocompromise, fulminant tissue necrosis can occur in setting of perineal inflammation, and aggressive therapy may be warranted. 2. Intrahepatic and extrahepatic biliary dilatation, cause uncertain. If clinically warranted this could be worked up with MRCP, although MRCP is most likely to provide useful information when the patient is not acutely ill and is able to breath hold. Some of the biliary dilatation could be a physiologic response to cholecystectomy. 3.  Aortic Atherosclerosis (ICD10-I70.0). 4. Sigmoid colon diverticulosis. 5. Lumbar spine and bilateral hip hardware without complicating Feature.  I, Daelin Haste, Charlesetta IvoryJenise V Bacon, personally viewed and evaluated these images (plain radiographs) as part of my medical decision making, as well as reviewing the written report by the radiologist. ____________________________________________  PROCEDURES  Zofran 4 mg IVP Morphine 2 mg IVP x 3 NS 1000 ml IV bolus   ____________________________________________  INITIAL IMPRESSION / ASSESSMENT AND PLAN / ED COURSE  ----------------------------------------- 6:09 PM on 01/31/2017 ----------------------------------------- Spoke with Dr. Earlene Plateravis (surgery). He deferred to GYN for surgical consultation and management.   ----------------------------------------- 6:22 PM on 01/31/2017 ----------------------------------------- Spoke with Dr. Feliberto GottronSchermerhorn: he will see the patient in the ED.  ____________________________________________  FINAL CLINICAL IMPRESSION(S) / ED DIAGNOSES  Final diagnoses:  Vulvar abscess      Naseem Varden, Charlesetta IvoryJenise V Bacon, PA-C 01/31/17 2050  Phineas Semen, MD 01/31/17 2129

## 2017-01-31 NOTE — ED Triage Notes (Signed)
Pt reports abscess to left upper thigh, "right beside my vagina" with drainage.

## 2017-01-31 NOTE — ED Notes (Signed)
Patient ambulated to and from the bathroom with a one-person assist.

## 2017-01-31 NOTE — ED Notes (Signed)
Folded chux placed underneath the patient's pelvis to absorb the thick, sero-sang drainage.

## 2017-01-31 NOTE — ED Notes (Signed)
Dr Feliberto GottronSchermerhorn here to assess patient.

## 2017-01-31 NOTE — Progress Notes (Signed)
Patient ID: Morgan SlaughterCarolyn H Lynch, female   DOB: 06-Mar-1946, 71 y.o.   MRN: [id number] Given low K+ I will replace K+ and proceed with surgery in am .

## 2017-01-31 NOTE — H&P (Signed)
Morgan Lynch is an 71 y.o. female.with a 2 week history of left vulvar abscess Started draining 6 days ago , but incrfeased in size and became more painful recently  Pertinent Gynecological History: M Menstrual History: Menarche age:  No LMP recorded. Patient has had a hysterectomy.  menopausal   Past Medical History:  Diagnosis Date  . Anxiety   . Arthritis   . Cerebral artery occlusion with cerebral infarction (HCC)   . GERD (gastroesophageal reflux disease)   . History of chicken pox   . History of measles   . History of mumps   . Hyperlipidemia   . Hypertension   . OSA (obstructive sleep apnea)   . Osteopenia   . Thyroid disease     Past Surgical History:  Procedure Laterality Date  . ABDOMINAL HYSTERECTOMY     unilateral OOP  . AUGMENTATION MAMMAPLASTY  1975 &1992   removal of implants in 1992  . CAROTID DOPPLER ULTRASOUND  01/04/2013   No hemodynamically significant stenosis  . CHOLECYSTECTOMY    . DOPPLER ECHOCARDIOGRAPHY  04/04/2013   mild MVR and TR. mild LVH  . MRI BRAIN,BRAIN STEM  01/04/2013   Chronic Ischemic changes. Old lacunar infarct. Severe sinusitis  . MYOCARDIAL PERFUSION SCAN  04/05/2013   Normal  . pulmonary function test  07/21/2010   Normal; Done by Dr. Welton FlakesKhan  . SPINAL FUSION  1988   lower back  . TOTAL HIP ARTHROPLASTY Left 2006    Family History  Problem Relation Age of Onset  . Pancreatic cancer Father   . Colon polyps Father   . Breast cancer Maternal Aunt     Social History:  reports that she has quit smoking. Her smoking use included Cigarettes. She has a 4.00 pack-year smoking history. She has never used smokeless tobacco. She reports that she uses drugs, including Marijuana. She reports that she does not drink alcohol.  Allergies:  Allergies  Allergen Reactions  . Oysters  [Shellfish Allergy]   . Simvastatin     Muscle Aches     (Not in a hospital admission)  ROS      endocrine : hypothyroid CV : + chest pain  remote , + HTN  GI : + colon polyps , no recent rectal bleeding  Pulm :+ sob remote, + sleep apnea  Gyn + vulvar pain Neurologic: + cerebral infarction  M/s : spinal fusion  Psych : + anxiety     Blood pressure (!) 126/56, pulse 63, temperature 98.2 F (36.8 C), temperature source Oral, resp. rate 16, height 5\' 1"  (1.549 m), weight 90.7 kg (200 lb), SpO2 96 %. Physical Exam   WDWN white female in NAD  Lungs CTA  CV RRR without murmur  Abd: soft NT  Pelvic : left labial / vulvar edema , induraton and mild erythema . 2 area actively draining Neuro CN grossly intact MS : nl LE strength    Results for orders placed or performed during the hospital encounter of 01/31/17 (from the past 24 hour(s))  Basic metabolic panel     Status: Abnormal   Collection Time: 01/31/17  4:33 PM  Result Value Ref Range   Sodium 140 135 - 145 mmol/L   Potassium 2.9 (L) 3.5 - 5.1 mmol/L   Chloride 108 101 - 111 mmol/L   CO2 23 22 - 32 mmol/L   Glucose, Bld 103 (H) 65 - 99 mg/dL   BUN 11 6 - 20 mg/dL   Creatinine, Ser 1.611.11 (H) 0.44 -  1.00 mg/dL   Calcium 8.7 (L) 8.9 - 10.3 mg/dL   GFR calc non Af Amer 49 (L) >60 mL/min   GFR calc Af Amer 57 (L) >60 mL/min   Anion gap 9 5 - 15  CBC with Differential     Status: Abnormal   Collection Time: 01/31/17  4:33 PM  Result Value Ref Range   WBC 20.6 (H) 3.6 - 11.0 K/uL   RBC 3.68 (L) 3.80 - 5.20 MIL/uL   Hemoglobin 11.1 (L) 12.0 - 16.0 g/dL   HCT 40.9 (L) 81.1 - 91.4 %   MCV 90.5 80.0 - 100.0 fL   MCH 30.1 26.0 - 34.0 pg   MCHC 33.3 32.0 - 36.0 g/dL   RDW 78.2 95.6 - 21.3 %   Platelets 400 150 - 440 K/uL   Neutrophils Relative % 82 %   Neutro Abs 16.7 (H) 1.4 - 6.5 K/uL   Lymphocytes Relative 12 %   Lymphs Abs 2.5 1.0 - 3.6 K/uL   Monocytes Relative 5 %   Monocytes Absolute 0.9 0.2 - 0.9 K/uL   Eosinophils Relative 1 %   Eosinophils Absolute 0.3 0 - 0.7 K/uL   Basophils Relative 0 %   Basophils Absolute 0.1 0 - 0.1 K/uL    Ct Abdomen Pelvis W  Contrast  Result Date: 01/31/2017 CLINICAL DATA:  Left upper thigh and suprapubic drainage. Pelvic abscess. EXAM: CT ABDOMEN AND PELVIS WITH CONTRAST TECHNIQUE: Multidetector CT imaging of the abdomen and pelvis was performed using the standard protocol following bolus administration of intravenous contrast. CONTRAST:  ISOVUE-300 IOPAMIDOL (ISOVUE-300) INJECTION 61% COMPARISON:  None. FINDINGS: Lower chest: Unremarkable Hepatobiliary: Intrahepatic biliary dilatation noted. Common hepatic duct 1.1 cm, common bile duct approximately 1.3 cm. Cholecystectomy. No focal liver lesion identified. Pancreas: Mild dilatation of the dorsal pancreatic duct in the pancreatic head, up to about 5 mm diameter on image 20/7, but without dilation of the duct in the pancreatic body or tail. No well-defined focal pancreatic lesion. Spleen: Unremarkable Adrenals/Urinary Tract: Unremarkable Stomach/Bowel: Appendix normal.  Sigmoid colon diverticulosis. Vascular/Lymphatic: Aortoiliac atherosclerotic vascular disease. Reproductive: Uterus absent.  Ovaries not well seen. Other: Abnormal collection of fluid with surrounding stranding in the left labium majus extending in the subcutaneous tissues of the left mons pubis towards the cutaneous surface. Drainage of this collection along the labia majora, perineum, and pubis not excluded. No obvious gas tracking along tissue planes in this vicinity. Musculoskeletal: Bilateral hip implants without obvious lucency around the visualized portions of the stems were the acetabular fixation screw components of the fell attachment. Graft harvest site from the right iliac bone. Posterolateral rod and pedicle screw fixation at L4-L5-S1 without lucency around the pedicle screws. Posterior decompression from L3-4 through S1. Lower thoracic and lumbar spondylosis and degenerative disc disease. IMPRESSION: 1. Abnormal collection of fluid suggesting abscess and phlegmon along the left labium majus, left  perineum, and extending along the left pubis. This is not observed to extend into the pelvis. In some patients, especially in the setting of diabetes or immunocompromise, fulminant tissue necrosis can occur in setting of perineal inflammation, and aggressive therapy may be warranted. 2. Intrahepatic and extrahepatic biliary dilatation, cause uncertain. If clinically warranted this could be worked up with MRCP, although MRCP is most likely to provide useful information when the patient is not acutely ill and is able to breath hold. Some of the biliary dilatation could be a physiologic response to cholecystectomy. 3.  Aortic Atherosclerosis (ICD10-I70.0). 4. Sigmoid colon diverticulosis.  5. Lumbar spine and bilateral hip hardware without complicating feature. Electronically Signed   By: Gaylyn Rong M.D.   On: 01/31/2017 17:50    Assessment/Plan: Vulvar abscess Hypokalemia   Admit   I+D drainage in OR  Start Vancomycin 1 gm q 12 hrs  K+ replacement    Ihor Austin Schermerhorn 01/31/2017, 8:39 PM

## 2017-02-01 ENCOUNTER — Encounter: Payer: Self-pay | Admitting: Obstetrics and Gynecology

## 2017-02-01 ENCOUNTER — Inpatient Hospital Stay: Payer: Medicare Other | Admitting: Anesthesiology

## 2017-02-01 ENCOUNTER — Encounter
Admission: EM | Disposition: A | Discharge status: Home / Self Care | Payer: Self-pay | Source: Home / Self Care | Attending: Obstetrics and Gynecology

## 2017-02-01 HISTORY — PX: VULVAR LESION REMOVAL: SHX5391

## 2017-02-01 LAB — CBC WITH DIFFERENTIAL/PLATELET
BASOS ABS: 0 10*3/uL (ref 0–0.1)
BASOS PCT: 0 %
EOS PCT: 2 %
Eosinophils Absolute: 0.4 10*3/uL (ref 0–0.7)
HCT: 33.3 % — ABNORMAL LOW (ref 35.0–47.0)
Hemoglobin: 11.1 g/dL — ABNORMAL LOW (ref 12.0–16.0)
Lymphocytes Relative: 15 %
Lymphs Abs: 2.3 10*3/uL (ref 1.0–3.6)
MCH: 31.1 pg (ref 26.0–34.0)
MCHC: 33.2 g/dL (ref 32.0–36.0)
MCV: 93.5 fL (ref 80.0–100.0)
MONO ABS: 0.8 10*3/uL (ref 0.2–0.9)
Monocytes Relative: 5 %
Neutro Abs: 12.3 10*3/uL — ABNORMAL HIGH (ref 1.4–6.5)
Neutrophils Relative %: 78 %
PLATELETS: 342 10*3/uL (ref 150–440)
RBC: 3.56 MIL/uL — ABNORMAL LOW (ref 3.80–5.20)
RDW: 13.2 % (ref 11.5–14.5)
WBC: 15.8 10*3/uL — ABNORMAL HIGH (ref 3.6–11.0)

## 2017-02-01 LAB — BASIC METABOLIC PANEL
ANION GAP: 8 (ref 5–15)
BUN: 8 mg/dL (ref 6–20)
CALCIUM: 8.4 mg/dL — AB (ref 8.9–10.3)
CO2: 23 mmol/L (ref 22–32)
Chloride: 109 mmol/L (ref 101–111)
Creatinine, Ser: 0.8 mg/dL (ref 0.44–1.00)
Glucose, Bld: 96 mg/dL (ref 65–99)
Potassium: 3.2 mmol/L — ABNORMAL LOW (ref 3.5–5.1)
SODIUM: 140 mmol/L (ref 135–145)

## 2017-02-01 SURGERY — VULVAR LESION
Anesthesia: General | Wound class: Dirty or Infected

## 2017-02-01 MED ORDER — SILVER NITRATE-POT NITRATE 75-25 % EX MISC
CUTANEOUS | Status: AC
  Filled 2017-02-01: qty 2

## 2017-02-01 MED ORDER — ONDANSETRON HCL 4 MG/2ML IJ SOLN: 4.0000 mg | Freq: Once | INTRAMUSCULAR | Status: DC | PRN

## 2017-02-01 MED ORDER — ONDANSETRON HCL 4 MG/2ML IJ SOLN
INTRAMUSCULAR | Status: AC
  Filled 2017-02-01: qty 2

## 2017-02-01 MED ORDER — PROPOFOL 10 MG/ML IV BOLUS
INTRAVENOUS | Status: DC | PRN
  Administered 2017-02-01: 130 mg via INTRAVENOUS

## 2017-02-01 MED ORDER — PANTOPRAZOLE SODIUM 40 MG PO TBEC
40.0000 mg | DELAYED_RELEASE_TABLET | Freq: Every day | ORAL | Status: DC
  Administered 2017-02-01 – 2017-02-03 (×3): 40 mg via ORAL
  Filled 2017-02-01 (×3): qty 1

## 2017-02-01 MED ORDER — FLUOXETINE HCL 20 MG PO CAPS
60.0000 mg | ORAL_CAPSULE | Freq: Every day | ORAL | Status: DC
  Administered 2017-02-01 – 2017-02-03 (×3): 60 mg via ORAL
  Filled 2017-02-01: qty 3
  Filled 2017-02-01: qty 6
  Filled 2017-02-01: qty 3
  Filled 2017-02-01: qty 6
  Filled 2017-02-01: qty 3

## 2017-02-01 MED ORDER — LEVOTHYROXINE SODIUM 150 MCG PO TABS
150.0000 ug | ORAL_TABLET | Freq: Every day | ORAL | Status: DC
  Administered 2017-02-01 – 2017-02-03 (×3): 150 ug via ORAL
  Filled 2017-02-01 (×3): qty 1

## 2017-02-01 MED ORDER — MORPHINE SULFATE (PF) 4 MG/ML IV SOLN
INTRAVENOUS | Status: AC
  Administered 2017-02-01: 1 mg
  Filled 2017-02-01: qty 1

## 2017-02-01 MED ORDER — FENTANYL CITRATE (PF) 100 MCG/2ML IJ SOLN
INTRAMUSCULAR | Status: DC | PRN
  Administered 2017-02-01 (×2): 50 ug via INTRAVENOUS

## 2017-02-01 MED ORDER — DEXAMETHASONE SODIUM PHOSPHATE 10 MG/ML IJ SOLN
INTRAMUSCULAR | Status: AC
  Filled 2017-02-01: qty 1

## 2017-02-01 MED ORDER — ONDANSETRON HCL 4 MG/2ML IJ SOLN
INTRAMUSCULAR | Status: DC | PRN
  Administered 2017-02-01: 4 mg via INTRAVENOUS

## 2017-02-01 MED ORDER — ACETAMINOPHEN 10 MG/ML IV SOLN
INTRAVENOUS | Status: AC
  Filled 2017-02-01: qty 100

## 2017-02-01 MED ORDER — EPHEDRINE SULFATE 50 MG/ML IJ SOLN
INTRAMUSCULAR | Status: DC | PRN
  Administered 2017-02-01: 10 mg via INTRAVENOUS

## 2017-02-01 MED ORDER — FENTANYL CITRATE (PF) 100 MCG/2ML IJ SOLN
INTRAMUSCULAR | Status: AC
  Filled 2017-02-01: qty 2

## 2017-02-01 MED ORDER — ACETAMINOPHEN 10 MG/ML IV SOLN
INTRAVENOUS | Status: DC | PRN
  Administered 2017-02-01: 1000 mg via INTRAVENOUS

## 2017-02-01 MED ORDER — DEXAMETHASONE SODIUM PHOSPHATE 10 MG/ML IJ SOLN
INTRAMUSCULAR | Status: DC | PRN
  Administered 2017-02-01: 10 mg via INTRAVENOUS

## 2017-02-01 MED ORDER — TOPIRAMATE 100 MG PO TABS
100.0000 mg | ORAL_TABLET | Freq: Two times a day (BID) | ORAL | Status: DC
  Administered 2017-02-01 – 2017-02-03 (×5): 100 mg via ORAL
  Filled 2017-02-01 (×5): qty 1

## 2017-02-01 MED ORDER — KETOROLAC TROMETHAMINE 30 MG/ML IJ SOLN
INTRAMUSCULAR | Status: DC | PRN
  Administered 2017-02-01: 30 mg via INTRAVENOUS

## 2017-02-01 MED ORDER — MIDAZOLAM HCL 2 MG/2ML IJ SOLN
INTRAMUSCULAR | Status: AC
  Filled 2017-02-01: qty 2

## 2017-02-01 MED ORDER — HYDROCODONE-ACETAMINOPHEN 5-325 MG PO TABS
1.0000 | ORAL_TABLET | ORAL | Status: DC | PRN
  Administered 2017-02-01 – 2017-02-03 (×10): 1 via ORAL
  Filled 2017-02-01 (×10): qty 1

## 2017-02-01 MED ORDER — KETOROLAC TROMETHAMINE 30 MG/ML IJ SOLN
INTRAMUSCULAR | Status: AC
  Filled 2017-02-01: qty 1

## 2017-02-01 MED ORDER — LIDOCAINE-EPINEPHRINE (PF) 1 %-1:200000 IJ SOLN
INTRAMUSCULAR | Status: DC | PRN
  Administered 2017-02-01: 4 mL

## 2017-02-01 MED ORDER — FENTANYL CITRATE (PF) 100 MCG/2ML IJ SOLN
25.0000 ug | INTRAMUSCULAR | Status: DC | PRN
Start: 2017-02-01 — End: 2017-02-01

## 2017-02-01 MED ORDER — PROPOFOL 10 MG/ML IV BOLUS
INTRAVENOUS | Status: AC
  Filled 2017-02-01: qty 20

## 2017-02-01 MED ORDER — LIDOCAINE-EPINEPHRINE (PF) 1 %-1:200000 IJ SOLN
INTRAMUSCULAR | Status: AC
  Filled 2017-02-01: qty 30

## 2017-02-01 MED ORDER — MORPHINE SULFATE (PF) 4 MG/ML IV SOLN: 1.0000 mg | Freq: Once | INTRAVENOUS | Status: DC

## 2017-02-01 SURGICAL SUPPLY — 29 items
BLADE SURG 15 STRL LF DISP TIS (BLADE) ×1 IMPLANT
BLADE SURG 15 STRL SS (BLADE) ×2
CANISTER SUCT 1200ML W/VALVE (MISCELLANEOUS) ×3 IMPLANT
CATH ROBINSON RED A/P 16FR (CATHETERS) ×3 IMPLANT
DRAPE PERI LITHO V/GYN (MISCELLANEOUS) ×3 IMPLANT
DRAPE UNDER BUTTOCK W/FLU (DRAPES) ×3 IMPLANT
GAUZE PACKING 1/4 X5 YD (GAUZE/BANDAGES/DRESSINGS) ×3 IMPLANT
GAUZE PACKING IODOFORM 1/2 (PACKING) ×3 IMPLANT
GLOVE BIO SURGEON STRL SZ8 (GLOVE) ×3 IMPLANT
GOWN STRL REUS W/ TWL LRG LVL3 (GOWN DISPOSABLE) ×1 IMPLANT
GOWN STRL REUS W/ TWL XL LVL3 (GOWN DISPOSABLE) ×1 IMPLANT
GOWN STRL REUS W/TWL LRG LVL3 (GOWN DISPOSABLE) ×2
GOWN STRL REUS W/TWL XL LVL3 (GOWN DISPOSABLE) ×2
KIT RM TURNOVER CYSTO AR (KITS) ×3 IMPLANT
NDL SAFETY 22GX1.5 (NEEDLE) ×3 IMPLANT
NEEDLE HYPO 21X1.5 SAFETY (NEEDLE) ×3 IMPLANT
NS IRRIG 500ML POUR BTL (IV SOLUTION) ×3 IMPLANT
PACK BASIN MINOR ARMC (MISCELLANEOUS) ×3 IMPLANT
PAD OB MATERNITY 4.3X12.25 (PERSONAL CARE ITEMS) ×3 IMPLANT
PAD PREP 24X41 OB/GYN DISP (PERSONAL CARE ITEMS) ×3 IMPLANT
SOL PREP PVP 2OZ (MISCELLANEOUS) ×6
SOLUTION PREP PVP 2OZ (MISCELLANEOUS) ×2 IMPLANT
SPONGE LAP 18X18 5 PK (GAUZE/BANDAGES/DRESSINGS) ×3 IMPLANT
SPONGE XRAY 4X4 16PLY STRL (MISCELLANEOUS) IMPLANT
SUT CHROMIC 3 0 SH 27 (SUTURE) IMPLANT
SUT VIC AB 2-0 SH 27 (SUTURE)
SUT VIC AB 2-0 SH 27XBRD (SUTURE) IMPLANT
SYR 10ML LL (SYRINGE) ×6 IMPLANT
TOWEL OR 17X26 4PK STRL BLUE (TOWEL DISPOSABLE) ×3 IMPLANT

## 2017-02-01 NOTE — Anesthesia Postprocedure Evaluation (Signed)
Anesthesia Post Note  Patient: Morgan Lynch  Procedure(s) Performed: Procedure(s) (LRB): INCISION AND DRAINAGE VULVAR ABSCESS (N/A)  Patient location during evaluation: PACU Anesthesia Type: General Level of consciousness: awake and alert Pain management: pain level controlled Vital Signs Assessment: post-procedure vital signs reviewed and stable Respiratory status: spontaneous breathing and respiratory function stable Cardiovascular status: stable Anesthetic complications: no     Last Vitals:  Vitals:   02/01/17 1002 02/01/17 1115  BP:  129/64  Pulse: 66 75  Resp: 16 10  Temp:  36.8 C  SpO2: 96% 95%    Last Pain:  Vitals:   02/01/17 1115  TempSrc:   PainSc: 0-No pain                 Shalee Paolo K

## 2017-02-01 NOTE — Transfer of Care (Signed)
Immediate Anesthesia Transfer of Care Note  Patient: Morgan Lynch  Procedure(s) Performed: Procedure(s): INCISION AND DRAINAGE VULVAR ABSCESS (N/A)  Patient Location: PACU  Anesthesia Type:General  Level of Consciousness: awake, oriented and sedated  Airway & Oxygen Therapy: Patient Spontanous Breathing and Patient connected to face mask oxygen  Post-op Assessment: Report given to RN and Post -op Vital signs reviewed and stable  Post vital signs: Reviewed and stable  Last Vitals:  Vitals:   02/01/17 0957 02/01/17 1002  BP: 136/72   Pulse:  66  Resp:  16  Temp:    SpO2:  96%    Last Pain:  Vitals:   02/01/17 1002  TempSrc:   PainSc: 5       Patients Stated Pain Goal: 2 (02/01/17 0945)  Complications: No apparent anesthesia complications

## 2017-02-01 NOTE — Brief Op Note (Signed)
01/31/2017 - 02/01/2017  11:04 AM  PATIENT:  Morgan Lynch  71 y.o. female  PRE-OPERATIVE DIAGNOSIS:  vulvar abscess  POST-OPERATIVE DIAGNOSIS:  vulvar abscess  PROCEDURE:  Procedure(s): INCISION AND DRAINAGE VULVAR ABSCESS (N/A) Wound packing  SURGEON:  Surgeon(s) and Role:    * Monaye Blackie, Ihor Austinhomas J, MD - Primary  PHYSICIAN ASSISTANT:   ASSISTANTS: none   ANESTHESIA:   general  EBL:  10 cc  IOF 500cc  UO 150 cc  BLOOD ADMINISTERED:none  DRAINS: wound packing 1/2' iodiform + 1/4 inch plain gauze   LOCAL MEDICATIONS USED:  LIDOCAINE  and Amount: 4 ml  SPECIMEN:  No Specimen  DISPOSITION OF SPECIMEN:  N/A  COUNTS:  YES  TOURNIQUET:  * No tourniquets in log *  DICTATION: .Other Dictation: Dictation Number verbal  PLAN OF CARE: already inpatient   PATIENT DISPOSITION:  PACU - hemodynamically stable.   Delay start of Pharmacological VTE agent (>24hrs) due to surgical blood loss or risk of bleeding: not applicable

## 2017-02-01 NOTE — Progress Notes (Signed)
Patient ID: Morgan SlaughterCarolyn H Necaise, female   DOB: January 01, 1946, 71 y.o.   MRN: [id number] DOS  Feels much better  She has two gauze packings one superior , one inferior left labia majora lateral   will cont abx awaiting culture results .  Probable Friday d/c home with packings removed before d/c . Probable po abx for an additional 7 days ( septra ds )

## 2017-02-01 NOTE — Anesthesia Preprocedure Evaluation (Signed)
Anesthesia Evaluation  Patient identified by MRN, date of birth, ID band Patient awake    Reviewed: Allergy & Precautions, NPO status   History of Anesthesia Complications Negative for: history of anesthetic complications  Airway Mallampati: III       Dental   Pulmonary asthma , sleep apnea , former smoker,           Cardiovascular hypertension, Pt. on medications + CAD and + Peripheral Vascular Disease       Neuro/Psych Anxiety Depression CVA    GI/Hepatic Neg liver ROS, GERD  Medicated,  Endo/Other  Hypothyroidism   Renal/GU negative Renal ROS     Musculoskeletal   Abdominal   Peds  Hematology   Anesthesia Other Findings   Reproductive/Obstetrics                             Anesthesia Physical Anesthesia Plan  ASA: III  Anesthesia Plan: General   Post-op Pain Management:    Induction: Intravenous  PONV Risk Score and Plan: 3 and Ondansetron, Dexamethasone, Midazolam and Propofol infusion  Airway Management Planned: LMA  Additional Equipment:   Intra-op Plan:   Post-operative Plan:   Informed Consent: I have reviewed the patients History and Physical, chart, labs and discussed the procedure including the risks, benefits and alternatives for the proposed anesthesia with the patient or authorized representative who has indicated his/her understanding and acceptance.     Plan Discussed with:   Anesthesia Plan Comments:         Anesthesia Quick Evaluation

## 2017-02-01 NOTE — Anesthesia Post-op Follow-up Note (Signed)
Anesthesia QCDR form completed.        

## 2017-02-01 NOTE — Progress Notes (Signed)
Subjective:HD #1 Vulvar abscess Pain better      Objective: I have reviewed patient's vital signs.  General: alert Resp: clear to auscultation bilaterally Cardio: regular rate and rhythm, S1, S2 normal, no murmur, click, rub or gallop K+ = 3.2 this am   Assessment/Plan: Vulvar abscess Hypokalemia  Scheduled for I+D today  Continue ABX   LOS: 1 day    Ihor Austinhomas J Venissa Nappi 02/01/2017, 8:34 AM

## 2017-02-01 NOTE — Anesthesia Procedure Notes (Signed)
Procedure Name: LMA Insertion Date/Time: 02/01/2017 10:56 AM Performed by: Junious SilkNOLES, Nahara Dona Pre-anesthesia Checklist: Patient identified, Patient being monitored, Timeout performed, Emergency Drugs available and Suction available Patient Re-evaluated:Patient Re-evaluated prior to induction Oxygen Delivery Method: Circle system utilized Preoxygenation: Pre-oxygenation with 100% oxygen Induction Type: IV induction Ventilation: Mask ventilation without difficulty LMA: LMA inserted LMA Size: 3.5 Tube type: Oral Number of attempts: 1 Placement Confirmation: positive ETCO2 and breath sounds checked- equal and bilateral Tube secured with: Tape Dental Injury: Teeth and Oropharynx as per pre-operative assessment

## 2017-02-02 LAB — BASIC METABOLIC PANEL
ANION GAP: 5 (ref 5–15)
BUN: 12 mg/dL (ref 6–20)
CALCIUM: 8.3 mg/dL — AB (ref 8.9–10.3)
CHLORIDE: 109 mmol/L (ref 101–111)
CO2: 25 mmol/L (ref 22–32)
Creatinine, Ser: 1.09 mg/dL — ABNORMAL HIGH (ref 0.44–1.00)
GFR calc non Af Amer: 50 mL/min — ABNORMAL LOW (ref >60)
GFR, EST AFRICAN AMERICAN: 58 mL/min — AB (ref >60)
Glucose, Bld: 115 mg/dL — ABNORMAL HIGH (ref 65–99)
Potassium: 3.2 mmol/L — ABNORMAL LOW (ref 3.5–5.1)
Sodium: 139 mmol/L (ref 135–145)

## 2017-02-02 LAB — CBC
HCT: 30.8 % — ABNORMAL LOW (ref 35.0–47.0)
HEMOGLOBIN: 10.2 g/dL — AB (ref 12.0–16.0)
MCH: 30.9 pg (ref 26.0–34.0)
MCHC: 33.2 g/dL (ref 32.0–36.0)
MCV: 93.2 fL (ref 80.0–100.0)
Platelets: 353 10*3/uL (ref 150–440)
RBC: 3.3 MIL/uL — ABNORMAL LOW (ref 3.80–5.20)
RDW: 13 % (ref 11.5–14.5)
WBC: 14.8 10*3/uL — AB (ref 3.6–11.0)

## 2017-02-02 LAB — TOPIRAMATE LEVEL: Topiramate Lvl: 5.5 ug/mL (ref 2.0–25.0)

## 2017-02-02 MED ORDER — DOCUSATE SODIUM 100 MG PO CAPS: 100.0000 mg | ORAL_CAPSULE | Freq: Every day | ORAL | Status: DC

## 2017-02-02 NOTE — Op Note (Signed)
NAMRaylene Lynch:  Delap, [name]              ACCOUNT NO.:  [id number]  MEDICAL RECORD NO.:  [id number]  LOCATION:                                 FACILITY:  PHYSICIAN:  Jennell Cornerhomas Gannon Heinzman, MD     DATE OF BIRTH:  DATE OF PROCEDURE: DATE OF DISCHARGE:                              OPERATIVE REPORT   PREOPERATIVE DIAGNOSIS:  Left labial/vulvar abscess.  POSTOPERATIVE DIAGNOSIS:  Left labial/vulvar abscess.  PROCEDURE PERFORMED:  Vulvar abscess incision and drainage and wound packing.  SURGEON:  Jennell Cornerhomas Towana Stenglein, MD  ANESTHESIA:  General endotracheal anesthesia.  SURGEON:  Jennell Cornerhomas Diahn Waidelich, MD.  INDICATION:  A 71 year old patient with a 2-week history of enlarging left labium majora and inguinal area.  The patient with significant induration and drainage.  DESCRIPTION OF PROCEDURE:  Adequate general endotracheal anesthesia, the patient was placed in a dorsal supine position, legs in the [place] stirrups.  The patient's lower abdomen, perineum, and vagina were prepped and draped in normal sterile fashion.  Time-out was performed. Left labia majora was noted to have marked induration and erythema. There were 2 draining areas that were then injected with 0.5% lidocaine with 1:200,000 epinephrine.  These draining areas were opened with a scalpel and Mosquito clamp was used to probe the tissues deeper about 15 mL of purulent discharge was expressed from both these areas.  Both of the wounds were packed.  The inferior area was packed with 0.5-inch iodoform gauze.  The superior left labia majora incision site was packed with quarter-inch non iodoform gauze.  There were no complications.  ESTIMATED BLOOD LOSS:  10 mL.  INTRAOPERATIVE FLUIDS:  600 mL.  URINE OUTPUT:  150 mL performed with red Robinson catheter prior to commencement of the case.  There were no complications.  The patient tolerated the procedure well.          ______________________________ Jennell Cornerhomas Wm Sahagun,  MD     TS/MEDQ  D:  02/01/2017  T:  02/01/2017  Job:  [id number]

## 2017-02-02 NOTE — Progress Notes (Signed)
Patient ID: Morgan Lynch, female   DOB: 1946-02-23, 71 y.o.   MRN: [id number]  70yo POD#1 from I&D of vulvar abscess in the OR, doing well.  S:  Pain improved, feels better overall. Tolerating regular po diet, +voiding spontaneousl  O: Vitals:   02/02/17 0738 02/02/17 1137  BP: 125/68 122/74  Pulse: 77 74  Resp: 18 20  Temp: 98 F (36.7 C) 98.4 F (36.9 C)  SpO2: 98% 98%    Culture from wound still pending, VSS. Continue vancomycin 1g q12 hrs until sensitivities return. Regular diet Continue precautions until culture returns

## 2017-02-03 LAB — AEROBIC CULTURE  (SUPERFICIAL SPECIMEN): SPECIAL REQUESTS: NORMAL

## 2017-02-03 LAB — AEROBIC CULTURE W GRAM STAIN (SUPERFICIAL SPECIMEN)

## 2017-02-03 MED ORDER — DOCUSATE SODIUM 100 MG PO CAPS: 100.0000 mg | ORAL_CAPSULE | Freq: Every day | ORAL | 0 refills | Status: DC

## 2017-02-03 MED ORDER — DOCUSATE SODIUM 100 MG PO CAPS
100.0000 mg | ORAL_CAPSULE | Freq: Every day | ORAL | Status: DC
  Administered 2017-02-03: 100 mg via ORAL
  Filled 2017-02-03 (×2): qty 1

## 2017-02-03 MED ORDER — HYDROCODONE-ACETAMINOPHEN 5-325 MG PO TABS: 1.0000 | ORAL_TABLET | ORAL | 0 refills | Status: DC | PRN

## 2017-02-03 NOTE — Discharge Summary (Signed)
Physician Discharge Summary  Patient ID: Morgan Lynch MRN: [id number] DOB/AGE: Feb 26, 1946 71 y.o.  Admit date: 01/31/2017 Discharge date: 02/03/2017  Admission Diagnoses:vulvar abscess , left labia   Discharge Diagnoses:  Active Problems:   Vulvar abscess   Discharged Condition: good  Hospital Course: pt was admittted and statred on IV vancomycin . Cultures performed . Replacement K+ . 12 hr after admission pt was taken to the OR where a I+D was performed on abscess and 2 drain gauze were placed   pain and induration dissipated . Marland Kitchen   Consults: None  Significant Diagnostic Studies: labs: Results for orders placed or performed during the hospital encounter of 01/31/17 (from the past 72 hour(s))  Basic metabolic panel     Status: Abnormal   Collection Time: 01/31/17  4:33 PM  Result Value Ref Range   Sodium 140 135 - 145 mmol/L   Potassium 2.9 (L) 3.5 - 5.1 mmol/L   Chloride 108 101 - 111 mmol/L   CO2 23 22 - 32 mmol/L   Glucose, Bld 103 (H) 65 - 99 mg/dL   BUN 11 6 - 20 mg/dL   Creatinine, Ser 1.11 (H) 0.44 - 1.00 mg/dL   Calcium 8.7 (L) 8.9 - 10.3 mg/dL   GFR calc non Af Amer 49 (L) >60 mL/min   GFR calc Af Amer 57 (L) >60 mL/min    Comment: (NOTE) The eGFR has been calculated using the CKD EPI equation. This calculation has not been validated in all clinical situations. eGFR's persistently <60 mL/min signify possible Chronic Kidney Disease.    Anion gap 9 5 - 15  CBC with Differential     Status: Abnormal   Collection Time: 01/31/17  4:33 PM  Result Value Ref Range   WBC 20.6 (H) 3.6 - 11.0 K/uL   RBC 3.68 (L) 3.80 - 5.20 MIL/uL   Hemoglobin 11.1 (L) 12.0 - 16.0 g/dL   HCT 33.3 (L) 35.0 - 47.0 %   MCV 90.5 80.0 - 100.0 fL   MCH 30.1 26.0 - 34.0 pg   MCHC 33.3 32.0 - 36.0 g/dL   RDW 13.1 11.5 - 14.5 %   Platelets 400 150 - 440 K/uL   Neutrophils Relative % 82 %   Neutro Abs 16.7 (H) 1.4 - 6.5 K/uL   Lymphocytes Relative 12 %   Lymphs Abs 2.5 1.0 - 3.6 K/uL    Monocytes Relative 5 %   Monocytes Absolute 0.9 0.2 - 0.9 K/uL   Eosinophils Relative 1 %   Eosinophils Absolute 0.3 0 - 0.7 K/uL   Basophils Relative 0 %   Basophils Absolute 0.1 0 - 0.1 K/uL  Wound or Superficial Culture     Status: None   Collection Time: 01/31/17  8:30 PM  Result Value Ref Range   Specimen Description VULVA    Special Requests Normal    Gram Stain      ABUNDANT WBC PRESENT, PREDOMINANTLY PMN MODERATE GRAM POSITIVE COCCI Performed at [facility], [address], [place], [place] [address]    Culture      MODERATE METHICILLIN RESISTANT STAPHYLOCOCCUS AUREUS   Report Status 02/03/2017 FINAL    Organism ID, Bacteria METHICILLIN RESISTANT STAPHYLOCOCCUS AUREUS       Susceptibility   Methicillin resistant staphylococcus aureus - MIC*    CIPROFLOXACIN <=0.5 SENSITIVE Sensitive     ERYTHROMYCIN >=8 RESISTANT Resistant     GENTAMICIN <=0.5 SENSITIVE Sensitive     OXACILLIN >=4 RESISTANT Resistant  TETRACYCLINE <=1 SENSITIVE Sensitive     VANCOMYCIN <=0.5 SENSITIVE Sensitive     TRIMETH/SULFA <=10 SENSITIVE Sensitive     CLINDAMYCIN <=0.25 SENSITIVE Sensitive     RIFAMPIN <=0.5 SENSITIVE Sensitive     Inducible Clindamycin NEGATIVE Sensitive     * MODERATE METHICILLIN RESISTANT STAPHYLOCOCCUS AUREUS  CBC WITH DIFFERENTIAL     Status: Abnormal   Collection Time: 02/01/17  4:11 AM  Result Value Ref Range   WBC 15.8 (H) 3.6 - 11.0 K/uL   RBC 3.56 (L) 3.80 - 5.20 MIL/uL   Hemoglobin 11.1 (L) 12.0 - 16.0 g/dL   HCT 33.3 (L) 35.0 - 47.0 %   MCV 93.5 80.0 - 100.0 fL   MCH 31.1 26.0 - 34.0 pg   MCHC 33.2 32.0 - 36.0 g/dL   RDW 13.2 11.5 - 14.5 %   Platelets 342 150 - 440 K/uL   Neutrophils Relative % 78 %   Neutro Abs 12.3 (H) 1.4 - 6.5 K/uL   Lymphocytes Relative 15 %   Lymphs Abs 2.3 1.0 - 3.6 K/uL   Monocytes Relative 5 %   Monocytes Absolute 0.8 0.2 - 0.9 K/uL   Eosinophils Relative 2 %   Eosinophils Absolute 0.4 0 - 0.7 K/uL   Basophils  Relative 0 %   Basophils Absolute 0.0 0 - 0.1 K/uL  Basic metabolic panel     Status: Abnormal   Collection Time: 02/01/17  4:11 AM  Result Value Ref Range   Sodium 140 135 - 145 mmol/L   Potassium 3.2 (L) 3.5 - 5.1 mmol/L   Chloride 109 101 - 111 mmol/L   CO2 23 22 - 32 mmol/L   Glucose, Bld 96 65 - 99 mg/dL   BUN 8 6 - 20 mg/dL   Creatinine, Ser 0.80 0.44 - 1.00 mg/dL   Calcium 8.4 (L) 8.9 - 10.3 mg/dL   GFR calc non Af Amer >60 >60 mL/min   GFR calc Af Amer >60 >60 mL/min    Comment: (NOTE) The eGFR has been calculated using the CKD EPI equation. This calculation has not been validated in all clinical situations. eGFR's persistently <60 mL/min signify possible Chronic Kidney Disease.    Anion gap 8 5 - 15  Topiramate level     Status: None   Collection Time: 02/01/17 12:09 PM  Result Value Ref Range   Topiramate Lvl 5.5 2.0 - 25.0 ug/mL    Comment: (NOTE)                                Detection Limit = 1.0 Performed At: [facility] [place], [place] [id number] Lindon Romp MD NW:[id number]   Basic metabolic panel     Status: Abnormal   Collection Time: 02/02/17  4:26 AM  Result Value Ref Range   Sodium 139 135 - 145 mmol/L   Potassium 3.2 (L) 3.5 - 5.1 mmol/L   Chloride 109 101 - 111 mmol/L   CO2 25 22 - 32 mmol/L   Glucose, Bld 115 (H) 65 - 99 mg/dL   BUN 12 6 - 20 mg/dL   Creatinine, Ser 1.09 (H) 0.44 - 1.00 mg/dL   Calcium 8.3 (L) 8.9 - 10.3 mg/dL   GFR calc non Af Amer 50 (L) >60 mL/min   GFR calc Af Amer 58 (L) >60 mL/min    Comment: (NOTE) The eGFR has been calculated using the CKD EPI equation.  This calculation has not been validated in all clinical situations. eGFR's persistently <60 mL/min signify possible Chronic Kidney Disease.    Anion gap 5 5 - 15  CBC     Status: Abnormal   Collection Time: 02/02/17  4:26 AM  Result Value Ref Range   WBC 14.8 (H) 3.6 - 11.0 K/uL   RBC 3.30 (L) 3.80 - 5.20 MIL/uL   Hemoglobin 10.2  (L) 12.0 - 16.0 g/dL   HCT 30.8 (L) 35.0 - 47.0 %   MCV 93.2 80.0 - 100.0 fL   MCH 30.9 26.0 - 34.0 pg   MCHC 33.2 32.0 - 36.0 g/dL   RDW 13.0 11.5 - 14.5 %   Platelets 353 150 - 440 K/uL    Treatments: surgery: I+D  Discharge Exam: Blood pressure 129/63, pulse (!) 57, temperature 98.7 F (37.1 C), temperature source Oral, resp. rate 18, height _0  (1.549 m), weight 90.7 kg (200 lb), SpO2 99 %. General appearance: alert Head: Normocephalic, without obvious abnormality, atraumatic Pelvic : induration decreased , no erythema  Drains removed  Disposition: 01-Home or Self Care  Discharge Instructions    Call MD for:  difficulty breathing, headache or visual disturbances    Complete by:  As directed    Call MD for:  extreme fatigue    Complete by:  As directed    Call MD for:  hives    Complete by:  As directed    Call MD for:  persistant dizziness or light-headedness    Complete by:  As directed    Call MD for:  persistant nausea and vomiting    Complete by:  As directed    Call MD for:  redness, tenderness, or signs of infection (pain, swelling, redness, odor or green/yellow discharge around incision site)    Complete by:  As directed    Call MD for:  severe uncontrolled pain    Complete by:  As directed    Call MD for:  temperature >100.4    Complete by:  As directed    Diet - low sodium heart healthy    Complete by:  As directed    Increase activity slowly    Complete by:  As directed      Allergies as of 02/03/2017      Reactions   Oysters  [shellfish Allergy]    Simvastatin    Muscle Aches      Medication List    STOP taking these medications   HYDROcodone-acetaminophen 7.5-325 MG tablet Commonly known as:  NORCO Replaced by:  HYDROcodone-acetaminophen 5-325 MG tablet     TAKE these medications   Armodafinil 250 MG tablet TAKE 1 TABLET BY MOUTH EVERY DAY   aspirin 81 MG tablet Take 81 mg by mouth daily.   atenolol-chlorthalidone 50-25 MG  tablet Commonly known as:  TENORETIC TAKE 1/2 TABLET BY MOUTH EVERY DAY   docusate sodium 100 MG capsule Commonly known as:  COLACE Take 1 capsule (100 mg total) by mouth daily.   EPINEPHrine 0.3 mg/0.3 mL Soaj injection Commonly known as:  EPI-PEN USE AS DIRECTED   FLUoxetine 20 MG capsule Commonly known as:  PROZAC TAKE 3 CAPSULES BY MOUTH EVERY DAY   fluticasone 50 MCG/ACT nasal spray Commonly known as:  FLONASE SHAKE LIQUID AND USE 2 SPRAYS IN EACH NOSTRIL EVERY NIGHT AT BEDTIME   HYDROcodone-acetaminophen 5-325 MG tablet Commonly known as:  NORCO/VICODIN Take 1 tablet by mouth every 4 (four) hours as needed for moderate pain.  Replaces:  HYDROcodone-acetaminophen 7.5-325 MG tablet   ipratropium 0.06 % nasal spray Commonly known as:  ATROVENT INHALE 2 SPRAYS IN EACH NOSTRIL THREE TIMES DAILY   modafinil 200 MG tablet Commonly known as:  PROVIGIL TAKE 2 TABLETS BY MOUTH ONCE DAILY   montelukast 10 MG tablet Commonly known as:  SINGULAIR TAKE 1 TABLET BY MOUTH EVERY DAY   naproxen 500 MG tablet Commonly known as:  NAPROSYN TAKE 1 TABLET BY MOUTH TWICE DAILY AS NEEDED   nystatin 100000 UNIT/ML suspension Commonly known as:  MYCOSTATIN RINSE WITH 4 ML THOROUGHLY AND SPIT OUT FOUR TIMES DAILY   omeprazole 20 MG capsule Commonly known as:  PRILOSEC TAKE 1 CAPSULE BY MOUTH DAILY   PRESCRIPTION MEDICATION CPAP every night   PROAIR HFA 108 (90 Base) MCG/ACT inhaler Generic drug:  albuterol USE 2 PUFFS BY INHALATION EVERY 6 HOURS AS NEEDED   SYNTHROID 150 MCG tablet Generic drug:  levothyroxine TAKE 1 TABLET(150 MCG) BY MOUTH DAILY BEFORE BREAKFAST   tiZANidine 4 MG tablet Commonly known as:  ZANAFLEX TAKE 1 TABLET BY MOUTH EVERY 8 HOURS AS NEEDED   topiramate 100 MG tablet Commonly known as:  TOPAMAX TAKE 1 TABLET BY MOUTH TWICE DAILY   triamcinolone cream 0.5 % Commonly known as:  KENALOG APPLY TOPICALLY UP TO THREE TIMES DAILY AS NEEDED   Vitamin D  (Ergocalciferol) 50000 units Caps capsule Commonly known as:  DRISDOL Take 50,000 Units by mouth every 7 (seven) days.      Follow-up Information    Kazuma Elena, Gwen Her, MD Follow up in 2 week(s).   Specialty:  Obstetrics and Gynecology Why:  10-14 days for vulvar abscess f/up  Contact information: [address] [place] [address] [phone]           Signed: Gwen Her Crystallynn Noorani 02/03/2017, 10:40 AM

## 2017-02-03 NOTE — Care Management Note (Signed)
Case Management Note  Patient Details  Name: Morgan Lynch MRN: [id number] Date of Birth: [birth year]  Subjective/Objective:   Admitted to [facility] with the diagnosis of vulvar abscess. Lives with her 72 year old dog. Gabriel Rung and Hope Budds are her son and daughter-in-law [phone]). Last seen Dr. Sherrie Mustache December 2017. Prescriptions are filled at [facility] in [place]. No home health. Skilled nursing following hip surgery in [place] 4 years ago. No home oxygen. No medical equipment in the home (camper). Takes care of all basic and instrumental activities of daily living herself, drives.  Daughter-in-law will transport                Action/Plan: No discharge needs identified at this time.   Expected Discharge Date:                  Expected Discharge Plan:     In-House Referral:     Discharge planning Services     Post Acute Care Choice:    Choice offered to:     DME Arranged:    DME Agency:     HH Arranged:    HH Agency:     Status of Service:     If discussed at [employer], dates discussed:    Additional Comments:  Gwenette Greet, RNMSN CCM Care Management [phone] 02/03/2017, 9:55 AM

## 2017-02-03 NOTE — Care Management Important Message (Signed)
Important Message  Patient Details  Name: ANAMARIE KIES MRN: [id number] Date of Birth: 1945-11-13   Medicare Important Message Given:  Yes    Gwenette Greet, RN 02/03/2017, 8:14 AM

## 2017-02-03 NOTE — Progress Notes (Signed)
Patient discharged home. Discharge instructions, prescriptions and follow up appointment given to and reviewed with patient. Patient verbalized understanding. Escorted out via wheelchair by nursing staff.

## 2017-02-03 NOTE — Progress Notes (Signed)
Subjective:HD#3 vulvar abscess and on IV Dicloxacillin  MRSA  Patient reports pain improving   packings still in .    Objective: I have reviewed patient's vital signs, labs and microbiology. = MRSA - sensative to vancomycin and Septra  pelvic : both gauze removed  Vulvar has decompressed    Assessment/Plan: MRSA vulvar abscess  LOS: 3 days  P; d/c home with septra ds bid x 10 days  RTC 2 week to see me at [facility] Liyla Radliff 02/03/2017, 10:26 AM

## 2017-02-10 DIAGNOSIS — R1032 Left lower quadrant pain: Secondary | ICD-10-CM | POA: Diagnosis not present

## 2017-02-10 DIAGNOSIS — Z8673 Personal history of transient ischemic attack (TIA), and cerebral infarction without residual deficits: Secondary | ICD-10-CM | POA: Diagnosis not present

## 2017-02-10 DIAGNOSIS — K219 Gastro-esophageal reflux disease without esophagitis: Secondary | ICD-10-CM | POA: Diagnosis not present

## 2017-02-10 DIAGNOSIS — E785 Hyperlipidemia, unspecified: Secondary | ICD-10-CM | POA: Diagnosis not present

## 2017-02-10 DIAGNOSIS — Z7982 Long term (current) use of aspirin: Secondary | ICD-10-CM | POA: Diagnosis not present

## 2017-02-10 DIAGNOSIS — Z79899 Other long term (current) drug therapy: Secondary | ICD-10-CM | POA: Diagnosis not present

## 2017-02-10 DIAGNOSIS — I10 Essential (primary) hypertension: Secondary | ICD-10-CM | POA: Diagnosis not present

## 2017-02-10 DIAGNOSIS — L02215 Cutaneous abscess of perineum: Secondary | ICD-10-CM | POA: Diagnosis not present

## 2017-02-10 DIAGNOSIS — K5790 Diverticulosis of intestine, part unspecified, without perforation or abscess without bleeding: Secondary | ICD-10-CM | POA: Diagnosis not present

## 2017-02-10 DIAGNOSIS — Z888 Allergy status to other drugs, medicaments and biological substances status: Secondary | ICD-10-CM | POA: Diagnosis not present

## 2017-02-10 DIAGNOSIS — M1990 Unspecified osteoarthritis, unspecified site: Secondary | ICD-10-CM | POA: Diagnosis not present

## 2017-02-10 DIAGNOSIS — R509 Fever, unspecified: Secondary | ICD-10-CM | POA: Diagnosis not present

## 2017-02-10 DIAGNOSIS — R6883 Chills (without fever): Secondary | ICD-10-CM | POA: Diagnosis not present

## 2017-02-10 DIAGNOSIS — Z981 Arthrodesis status: Secondary | ICD-10-CM | POA: Diagnosis not present

## 2017-02-10 DIAGNOSIS — R944 Abnormal results of kidney function studies: Secondary | ICD-10-CM | POA: Diagnosis not present

## 2017-02-10 DIAGNOSIS — R110 Nausea: Secondary | ICD-10-CM | POA: Diagnosis not present

## 2017-02-11 DIAGNOSIS — K5790 Diverticulosis of intestine, part unspecified, without perforation or abscess without bleeding: Secondary | ICD-10-CM | POA: Diagnosis not present

## 2017-02-11 DIAGNOSIS — L02215 Cutaneous abscess of perineum: Secondary | ICD-10-CM | POA: Diagnosis not present

## 2017-02-13 ENCOUNTER — Encounter: Payer: Self-pay | Admitting: Obstetrics and Gynecology

## 2017-02-13 ENCOUNTER — Other Ambulatory Visit: Payer: Self-pay | Admitting: Family Medicine

## 2017-02-13 NOTE — Telephone Encounter (Addendum)
Pt contacted office for refill request on the following medications:  HYDROcodone-acetaminophen (NORCO/VICODIN) 7.5-325 MG tablet   CB#[phone]/MW  Pt states she is missing her Rx that she just got filled first of the month.  Pt states she had to go to [facility] and dId not take her medication with her.   Pt states when she came back home the bottle and pills are missing/MW   Pt states she did not get the Rx filled that Dr Feliberto Gottron gave her/MW

## 2017-02-14 MED ORDER — HYDROCODONE-ACETAMINOPHEN 5-325 MG PO TABS: 1.0000 | ORAL_TABLET | ORAL | 0 refills | Status: DC | PRN

## 2017-02-21 ENCOUNTER — Other Ambulatory Visit: Payer: Self-pay | Admitting: Family Medicine

## 2017-02-21 NOTE — Telephone Encounter (Signed)
Please review. Thanks!  

## 2017-02-21 NOTE — Telephone Encounter (Signed)
Pt needs a refill on her hydrocodone 7.5 for 90 pills.  She said her last rx was for 5-325 and that wasn't strong enough.  Thanks, Fortune Brandsteri

## 2017-02-23 NOTE — Telephone Encounter (Signed)
Pt has called back inquiring about her rx.  Thanks Fortune Brandsteri

## 2017-02-24 MED ORDER — HYDROCODONE-ACETAMINOPHEN 7.5-325 MG PO TABS: 1.0000 | ORAL_TABLET | Freq: Four times a day (QID) | ORAL | 0 refills | Status: DC | PRN

## 2017-02-24 MED ORDER — HYDROCODONE-ACETAMINOPHEN 5-325 MG PO TABS: 1.0000 | ORAL_TABLET | ORAL | 0 refills | Status: DC | PRN

## 2017-02-24 NOTE — Telephone Encounter (Signed)
Pt requesting 7.5 -325 mg instead of 5 325 mg and for 90 pills not 30. She says there was a mix up when she was in the hospital and was getting IV pain meds. Please advise. (she has not picked up rx for 5/325 mg yet)

## 2017-02-24 NOTE — Addendum Note (Signed)
Addended by: Mila MerryFISHER, Letisha Yera E on: 02/24/2017 12:27 PM   Modules accepted: Orders

## 2017-03-07 ENCOUNTER — Other Ambulatory Visit: Payer: Self-pay | Admitting: Family Medicine

## 2017-03-07 NOTE — Telephone Encounter (Signed)
Please call in armodafinil.

## 2017-03-07 NOTE — Telephone Encounter (Signed)
Rx called in to pharmacy. 

## 2017-03-23 ENCOUNTER — Other Ambulatory Visit: Payer: Self-pay | Admitting: Family Medicine

## 2017-03-23 NOTE — Telephone Encounter (Signed)
Pt contacted office for refill request on the following medications:  HYDROcodone-acetaminophen (NORCO) 7.5-325 MG tablet   CB#[phone]/MW

## 2017-03-24 MED ORDER — HYDROCODONE-ACETAMINOPHEN 7.5-325 MG PO TABS: 1.0000 | ORAL_TABLET | Freq: Four times a day (QID) | ORAL | 0 refills | Status: DC | PRN

## 2017-04-18 ENCOUNTER — Encounter: Payer: Self-pay | Admitting: Family Medicine

## 2017-04-26 ENCOUNTER — Other Ambulatory Visit: Payer: Self-pay | Admitting: Family Medicine

## 2017-04-26 DIAGNOSIS — Z1231 Encounter for screening mammogram for malignant neoplasm of breast: Secondary | ICD-10-CM

## 2017-05-03 ENCOUNTER — Other Ambulatory Visit: Payer: Self-pay | Admitting: Family Medicine

## 2017-05-03 MED ORDER — HYDROCODONE-ACETAMINOPHEN 7.5-325 MG PO TABS: 1.0000 | ORAL_TABLET | Freq: Four times a day (QID) | ORAL | 0 refills | Status: DC | PRN

## 2017-05-03 NOTE — Telephone Encounter (Signed)
Pt contacted office for refill request on the following medications:  HYDROcodone-acetaminophen (NORCO) 7.5-325 MG tablet   Last Rx: 03/24/17  Please advise. Thanks TNP

## 2017-05-09 ENCOUNTER — Encounter: Payer: Medicare Other | Admitting: Family Medicine

## 2017-05-09 ENCOUNTER — Ambulatory Visit: Payer: Medicare Other

## 2017-05-18 ENCOUNTER — Ambulatory Visit
Admission: RE | Admit: 2017-05-18 | Discharge: 2017-05-18 | Disposition: A | Discharge status: Home / Self Care | Payer: Medicare Other | Source: Ambulatory Visit | Attending: Family Medicine | Admitting: Family Medicine

## 2017-05-18 DIAGNOSIS — Z1231 Encounter for screening mammogram for malignant neoplasm of breast: Secondary | ICD-10-CM | POA: Insufficient documentation

## 2017-05-23 ENCOUNTER — Ambulatory Visit (INDEPENDENT_AMBULATORY_CARE_PROVIDER_SITE_OTHER): Payer: Medicare Other

## 2017-05-23 ENCOUNTER — Ambulatory Visit (INDEPENDENT_AMBULATORY_CARE_PROVIDER_SITE_OTHER): Payer: Medicare Other | Admitting: Family Medicine

## 2017-05-23 VITALS — BP 160/78

## 2017-05-23 VITALS — BP 160/86 | HR 72 | Temp 98.4°F | Ht 61.0 in | Wt 193.4 lb

## 2017-05-23 DIAGNOSIS — E785 Hyperlipidemia, unspecified: Secondary | ICD-10-CM | POA: Diagnosis not present

## 2017-05-23 DIAGNOSIS — I2510 Atherosclerotic heart disease of native coronary artery without angina pectoris: Secondary | ICD-10-CM

## 2017-05-23 DIAGNOSIS — E559 Vitamin D deficiency, unspecified: Secondary | ICD-10-CM | POA: Diagnosis not present

## 2017-05-23 DIAGNOSIS — G4733 Obstructive sleep apnea (adult) (pediatric): Secondary | ICD-10-CM | POA: Diagnosis not present

## 2017-05-23 DIAGNOSIS — K219 Gastro-esophageal reflux disease without esophagitis: Secondary | ICD-10-CM | POA: Diagnosis not present

## 2017-05-23 DIAGNOSIS — I10 Essential (primary) hypertension: Secondary | ICD-10-CM | POA: Diagnosis not present

## 2017-05-23 DIAGNOSIS — E039 Hypothyroidism, unspecified: Secondary | ICD-10-CM | POA: Diagnosis not present

## 2017-05-23 DIAGNOSIS — Z0000 Encounter for general adult medical examination without abnormal findings: Secondary | ICD-10-CM

## 2017-05-23 NOTE — Patient Instructions (Signed)
Morgan Lynch , Thank you for taking time to come for your Medicare Wellness Visit. I appreciate your ongoing commitment to your health goals. Please review the following plan we discussed and let me know if I can assist you in the future.   Screening recommendations/referrals: Colonoscopy: up to date Mammogram: no longer required Bone Density: up to date Recommended yearly ophthalmology/optometry visit for glaucoma screening and checkup Recommended yearly dental visit for hygiene and checkup  Vaccinations: Influenza vaccine: declined Pneumococcal vaccine: declined Tdap vaccine: up to date Shingles vaccine: last received 03/2012  Advanced directives: Advance directive discussed with you today. Even though you declined this today please call our office should you change your mind and we can give you the proper paperwork for you to fill out.  Conditions/risks identified: Obesity- recommend to continue current diet regimen of cutting out sweets and junk food.   Next appointment: 3:00 PM today   Preventive Care 65 Years and Older, Female Preventive care refers to lifestyle choices and visits with your health care provider that can promote health and wellness. What does preventive care include?  A yearly physical exam. This is also called an annual well check.  Dental exams once or twice a year.  Routine eye exams. Ask your health care provider how often you should have your eyes checked.  Personal lifestyle choices, including:  Daily care of your teeth and gums.  Regular physical activity.  Eating a healthy diet.  Avoiding tobacco and drug use.  Limiting alcohol use.  Practicing safe sex.  Taking low-dose aspirin every day.  Taking vitamin and mineral supplements as recommended by your health care provider. What happens during an annual well check? The services and screenings done by your health care provider during your annual well check will depend on your age, overall  health, lifestyle risk factors, and family history of disease. Counseling  Your health care provider may ask you questions about your:  Alcohol use.  Tobacco use.  Drug use.  Emotional well-being.  Home and relationship well-being.  Sexual activity.  Eating habits.  History of falls.  Memory and ability to understand (cognition).  Work and work Astronomerenvironment.  Reproductive health. Screening  You may have the following tests or measurements:  Height, weight, and BMI.  Blood pressure.  Lipid and cholesterol levels. These may be checked every 5 years, or more frequently if you are over 72 years old.  Skin check.  Lung cancer screening. You may have this screening every year starting at age 72 if you have a 30-pack-year history of smoking and currently smoke or have quit within the past 15 years.  Fecal occult blood test (FOBT) of the stool. You may have this test every year starting at age 72.  Flexible sigmoidoscopy or colonoscopy. You may have a sigmoidoscopy every 5 years or a colonoscopy every 10 years starting at age 72.  Hepatitis C blood test.  Hepatitis B blood test.  Sexually transmitted disease (STD) testing.  Diabetes screening. This is done by checking your blood sugar (glucose) after you have not eaten for a while (fasting). You may have this done every 1-3 years.  Bone density scan. This is done to screen for osteoporosis. You may have this done starting at age 72.  Mammogram. This may be done every 1-2 years. Talk to your health care provider about how often you should have regular mammograms. Talk with your health care provider about your test results, treatment options, and if necessary, the need for  more tests. Vaccines  Your health care provider may recommend certain vaccines, such as:  Influenza vaccine. This is recommended every year.  Tetanus, diphtheria, and acellular pertussis (Tdap, Td) vaccine. You may need a Td booster every 10  years.  Zoster vaccine. You may need this after age 34.  Pneumococcal 13-valent conjugate (PCV13) vaccine. One dose is recommended after age 1.  Pneumococcal polysaccharide (PPSV23) vaccine. One dose is recommended after age 11. Talk to your health care provider about which screenings and vaccines you need and how often you need them. This information is not intended to replace advice given to you by your health care provider. Make sure you discuss any questions you have with your health care provider. Document Released: 07/03/2015 Document Revised: 02/24/2016 Document Reviewed: 04/07/2015 Elsevier Interactive Patient Education  2017 [employer] Prevention in the Home Falls can cause injuries. They can happen to people of all ages. There are many things you can do to make your home safe and to help prevent falls. What can I do on the outside of my home?  Regularly fix the edges of walkways and driveways and fix any cracks.  Remove anything that might make you trip as you walk through a door, such as a raised step or threshold.  Trim any bushes or trees on the path to your home.  Use bright outdoor lighting.  Clear any walking paths of anything that might make someone trip, such as rocks or tools.  Regularly check to see if handrails are loose or broken. Make sure that both sides of any steps have handrails.  Any raised decks and porches should have guardrails on the edges.  Have any leaves, snow, or ice cleared regularly.  Use sand or salt on walking paths during winter.  Clean up any spills in your garage right away. This includes oil or grease spills. What can I do in the bathroom?  Use night lights.  Install grab bars by the toilet and in the tub and shower. Do not use towel bars as grab bars.  Use non-skid mats or decals in the tub or shower.  If you need to sit down in the shower, use a plastic, non-slip stool.  Keep the floor dry. Clean up any water that  spills on the floor as soon as it happens.  Remove soap buildup in the tub or shower regularly.  Attach bath mats securely with double-sided non-slip rug tape.  Do not have throw rugs and other things on the floor that can make you trip. What can I do in the bedroom?  Use night lights.  Make sure that you have a light by your bed that is easy to reach.  Do not use any sheets or blankets that are too big for your bed. They should not hang down onto the floor.  Have a firm chair that has side arms. You can use this for support while you get dressed.  Do not have throw rugs and other things on the floor that can make you trip. What can I do in the kitchen?  Clean up any spills right away.  Avoid walking on wet floors.  Keep items that you use a lot in easy-to-reach places.  If you need to reach something above you, use a strong step stool that has a grab bar.  Keep electrical cords out of the way.  Do not use floor polish or wax that makes floors slippery. If you must use wax, use non-skid  floor wax.  Do not have throw rugs and other things on the floor that can make you trip. What can I do with my stairs?  Do not leave any items on the stairs.  Make sure that there are handrails on both sides of the stairs and use them. Fix handrails that are broken or loose. Make sure that handrails are as long as the stairways.  Check any carpeting to make sure that it is firmly attached to the stairs. Fix any carpet that is loose or worn.  Avoid having throw rugs at the top or bottom of the stairs. If you do have throw rugs, attach them to the floor with carpet tape.  Make sure that you have a light switch at the top of the stairs and the bottom of the stairs. If you do not have them, ask someone to add them for you. What else can I do to help prevent falls?  Wear shoes that:  Do not have high heels.  Have rubber bottoms.  Are comfortable and fit you well.  Are closed at the  toe. Do not wear sandals.  If you use a stepladder:  Make sure that it is fully opened. Do not climb a closed stepladder.  Make sure that both sides of the stepladder are locked into place.  Ask someone to hold it for you, if possible.  Clearly mark and make sure that you can see:  Any grab bars or handrails.  First and last steps.  Where the edge of each step is.  Use tools that help you move around (mobility aids) if they are needed. These include:  Canes.  Walkers.  Scooters.  Crutches.  Turn on the lights when you go into a dark area. Replace any light bulbs as soon as they burn out.  Set up your furniture so you have a clear path. Avoid moving your furniture around.  If any of your floors are uneven, fix them.  If there are any pets around you, be aware of where they are.  Review your medicines with your doctor. Some medicines can make you feel dizzy. This can increase your chance of falling. Ask your doctor what other things that you can do to help prevent falls. This information is not intended to replace advice given to you by your health care provider. Make sure you discuss any questions you have with your health care provider. Document Released: 04/02/2009 Document Revised: 11/12/2015 Document Reviewed: 07/11/2014 Elsevier Interactive Patient Education  2017 [employer].

## 2017-05-23 NOTE — Progress Notes (Signed)
Subjective:   Morgan SlaughterCarolyn H Lynch is a 71 y.o. female who presents for an Initial Medicare Annual Wellness Visit.  Review of Systems    N/A  Cardiac Risk Factors include: advanced age (>3755men, 68>65 women);dyslipidemia;obesity (BMI >30kg/m2);smoking/ tobacco exposure;hypertension     Objective:    Today's Vitals   05/23/17 1433 05/23/17 1438  BP: (!) 172/80 (!) 160/86  Pulse: 72   Temp: 98.4 F (36.9 C)   TempSrc: Oral   Weight: 193 lb 6.4 oz (87.7 kg)   Height: 5\' 1"  (1.549 m)   PainSc: 7  7   PainLoc: Back    Body mass index is 36.54 kg/m.  Advanced Directives 05/23/2017 01/31/2017  Does Patient Have a Medical Advance Directive? No No  Would patient like information on creating a medical advance directive? No - Patient declined No - Patient declined    Current Medications (verified) Outpatient Encounter Medications as of 05/23/2017  Medication Sig  . Armodafinil 250 MG tablet TAKE 1 TABLET BY MOUTH EVERY DAY  . aspirin 81 MG tablet Take 81 mg by mouth daily.   Marland Kitchen. atenolol-chlorthalidone (TENORETIC) 50-25 MG tablet TAKE 1/2 TABLET BY MOUTH EVERY DAY  . Biotin 1 MG CAPS Take by mouth daily.   Marland Kitchen. EPINEPHRINE 0.3 mg/0.3 mL IJ SOAJ injection USE AS DIRECTED  . FLUoxetine (PROZAC) 20 MG capsule TAKE 3 CAPSULES BY MOUTH EVERY DAY  . fluticasone (FLONASE) 50 MCG/ACT nasal spray SHAKE LIQUID AND USE 2 SPRAYS IN EACH NOSTRIL EVERY NIGHT AT BEDTIME  . HYDROcodone-acetaminophen (NORCO) 7.5-325 MG tablet Take 1 tablet every 6 (six) hours as needed by mouth for moderate pain.  Marland Kitchen. ipratropium (ATROVENT) 0.06 % nasal spray INHALE 2 SPRAYS IN EACH NOSTRIL THREE TIMES DAILY  . montelukast (SINGULAIR) 10 MG tablet TAKE 1 TABLET BY MOUTH EVERY DAY  . Multiple Vitamins-Minerals (CENTRUM SILVER 50+MEN) TABS Take by mouth.  . naproxen (NAPROSYN) 500 MG tablet TAKE 1 TABLET BY MOUTH TWICE DAILY AS NEEDED  . omeprazole (PRILOSEC) 20 MG capsule TAKE 1 CAPSULE BY MOUTH DAILY  . PRESCRIPTION MEDICATION  CPAP every night  . PROAIR HFA 108 (90 Base) MCG/ACT inhaler USE 2 PUFFS BY INHALATION EVERY 6 HOURS AS NEEDED  . SYNTHROID 150 MCG tablet TAKE 1 TABLET(150 MCG) BY MOUTH DAILY BEFORE BREAKFAST  . tiZANidine (ZANAFLEX) 4 MG tablet TAKE 1 TABLET BY MOUTH EVERY 8 HOURS AS NEEDED  . topiramate (TOPAMAX) 100 MG tablet TAKE 1 TABLET BY MOUTH TWICE DAILY  . Vitamin D, Ergocalciferol, (DRISDOL) 50000 UNITS CAPS capsule Take 50,000 Units by mouth every 7 (seven) days.   . [DISCONTINUED] LUTEIN PO Take by mouth.  . diphenhydrAMINE (BENADRYL) 25 MG tablet Take by mouth.  . triamcinolone cream (KENALOG) 0.5 % APPLY TOPICALLY UP TO THREE TIMES DAILY AS NEEDED (Patient not taking: Reported on 05/23/2017)  . [DISCONTINUED] docusate sodium (COLACE) 100 MG capsule Take 1 capsule (100 mg total) by mouth daily.  . [DISCONTINUED] modafinil (PROVIGIL) 200 MG tablet TAKE 2 TABLETS BY MOUTH ONCE DAILY (Patient not taking: Reported on 05/23/2017)  . [DISCONTINUED] nystatin (MYCOSTATIN) 100000 UNIT/ML suspension RINSE WITH 4 ML THOROUGHLY AND SPIT OUT FOUR TIMES DAILY   No facility-administered encounter medications on file as of 05/23/2017.     Allergies (verified) Oysters  [shellfish allergy] and Simvastatin   History: Past Medical History:  Diagnosis Date  . Anxiety   . Arthritis   . Cerebral artery occlusion with cerebral infarction (HCC)   . GERD (gastroesophageal reflux disease)   .  Herniated disc, cervical   . History of chicken pox   . History of measles   . History of mumps   . Hyperlipidemia   . Hypertension   . OSA (obstructive sleep apnea)   . Osteopenia   . Thyroid disease    Past Surgical History:  Procedure Laterality Date  . ABDOMINAL HYSTERECTOMY     unilateral OOP  . AUGMENTATION MAMMAPLASTY  1975 &1992   removal of implants in 1992  . CAROTID DOPPLER ULTRASOUND  01/04/2013   No hemodynamically significant stenosis  . CHOLECYSTECTOMY    . DOPPLER ECHOCARDIOGRAPHY  04/04/2013    mild MVR and TR. mild LVH  . MRI BRAIN,BRAIN STEM  01/04/2013   Chronic Ischemic changes. Old lacunar infarct. Severe sinusitis  . MYOCARDIAL PERFUSION SCAN  04/05/2013   Normal  . pulmonary function test  07/21/2010   Normal; Done by Dr. Welton FlakesKhan  . SPINAL FUSION  1988   lower back  . TOTAL HIP ARTHROPLASTY Left 2006  . VULVAR LESION REMOVAL N/A 02/01/2017   Procedure: INCISION AND DRAINAGE VULVAR ABSCESS;  Surgeon: Feliberto GottronSchermerhorn, Ihor Austinhomas J, MD;  Location: [facility] ORS;  Service: Gynecology;  Laterality: N/A;   Family History  Problem Relation Age of Onset  . Cancer Mother        female cancer (?)  . Pancreatic cancer Father   . Colon polyps Father   . Breast cancer Maternal Aunt    Social History   Socioeconomic History  . Marital status: Single    Spouse name: None  . Number of children: 5  . Years of education: None  . Highest education level: None  Social Needs  . Financial resource strain: None  . Food insecurity - worry: None  . Food insecurity - inability: None  . Transportation needs - medical: None  . Transportation needs - non-medical: None  Occupational History  . Occupation: Disabled  Tobacco Use  . Smoking status: Former Smoker    Packs/day: 0.50    Years: 8.00    Pack years: 4.00    Types: Cigarettes  . Smokeless tobacco: Never Used  Substance and Sexual Activity  . Alcohol use: No    Alcohol/week: 0.0 oz  . Drug use: Yes    Types: Marijuana    Comment: uses marijuana  . Sexual activity: None  Other Topics Concern  . None  Social History Narrative  . None    Tobacco Counseling Counseling given: Not Answered   Clinical Intake:  Pre-visit preparation completed: Yes  Pain : 0-10 Pain Score: 7  Pain Type: Chronic pain Pain Location: Back(right leg and neck pain) Pain Orientation: Lower Pain Descriptors / Indicators: Radiating(back pain radiates down right leg)     Nutritional Status: BMI > 30  Obese Nutritional Risks: None Diabetes:  No  Activities of Daily Living: Independent Ambulation: Independent with device- listed below Home Assistive Devices/Equipment: Eyeglasses, CPAP, Dentures (specify type), Other (Comment), Cane, Reacher(wears a full set on top and partial dentures on bottom) Medication Administration: Independent Home Management: Independent  Barriers to Care Management & Learning: None Psychosocial Barriers: Financial need(Lives in a camper and has trouble paying to remove mold and roaches.)  Do you feel unsafe in your current relationship?: No(single) Do you feel physically threatened by others?: No Anyone hurting you at home, work, or school?: No Unable to ask?: No Information provided on [employer] resources: Yes(Referral sent to careguide for financial assistance)  How often do you need to have someone help you when you  read instructions, pamphlets, or other written materials from your doctor or pharmacy?: 1 - Never What is the last grade level you completed in school?: 12th grade  Interpreter Needed?: No  Information entered by :: [facility], LPN   Activities of Daily Living In your present state of health, do you have any difficulty performing the following activities: 05/23/2017 01/31/2017  Hearing? N N  Vision? Y N  Comment has a cataract in the left eye -  Difficulty concentrating or making decisions? N N  Walking or climbing stairs? Y Y  Comment due to back pain going upstairs   Dressing or bathing? N N  Doing errands, shopping? N N  Preparing Food and eating ? N -  Using the Toilet? N -  In the past six months, have you accidently leaked urine? Y -  Comment occasionally after waking up from sleep -  Do you have problems with loss of bowel control? N -  Managing your Medications? N -  Managing your Finances? N -  Housekeeping or managing your Housekeeping? N -  Some recent data might be hidden    Immunizations and Health Maintenance Immunization History  Administered Date(s)  Administered  . Pneumococcal Polysaccharide-23 03/23/2012  . Tdap 03/23/2012  . Zoster 03/23/2012   Health Maintenance Due  Topic Date Due  . Hepatitis C Screening  [birth year]  . PNA vac Low Risk Adult (2 of 2 - PCV13) 03/23/2013  . INFLUENZA VACCINE  01/18/2017    Patient Care Team: Malva Limes, MD as PCP - General (Family Medicine) Rana Snare, MD as Consulting Physician (Internal Medicine)  Indicate any recent Medical Services you may have received from other than [facility] providers in the past year (date may be approximate).     Assessment:   This is a routine wellness examination for [place].   Hearing/Vision screen Vision Screening Comments: Pt does not have regular vision checks. Last eye exam was over a year ago.   Dietary issues and exercise activities discussed: Current Exercise Habits: The patient does not participate in regular exercise at present, Exercise limited by: orthopedic condition(s)  Goals    None     Depression Screen PHQ 2/9 Scores 05/23/2017 02/24/2016 03/06/2015  PHQ - 2 Score 0 0 0  PHQ- 9 Score 4 - 7    Fall Risk Fall Risk  02/24/2016  Falls in the past year? No    Is the patient's home free of loose throw rugs in walkways, pet beds, electrical cords, etc?   yes      Grab bars in the bathroom? yes      Handrails on the stairs?   yes      Adequate lighting?   yes  Cognitive Function:     6CIT Screen 05/23/2017  What Year? 0 points  What month? 0 points  What time? 0 points  Count back from 20 0 points  Months in reverse 0 points  Repeat phrase 4 points  Total Score 4    Screening Tests Health Maintenance  Topic Date Due  . Hepatitis C Screening  Nov 02, 1945  . PNA vac Low Risk Adult (2 of 2 - PCV13) 03/23/2013  . INFLUENZA VACCINE  01/18/2017  . MAMMOGRAM  05/19/2019  . COLONOSCOPY  05/23/2021  . TETANUS/TDAP  03/23/2022  . DEXA SCAN  Completed    Cancer Screenings: Lung: Low Dose CT Chest recommended if Age 86-80 years,  30 pack-year currently smoking OR have quit w/in 15years. Patient does not  qualify. Breast: Up to date on Mammogram? Yes  Up to date of Bone Density/Dexa? Yes Colorectal: Yes  Additional Screenings:  Hepatitis B/HIV/Syphillis: declined Hepatitis C Screening: declined     Plan:  I have personally reviewed and addressed the Medicare Annual Wellness questionnaire and have noted the following in the patient's chart:  A. Medical and social history B. Use of alcohol, tobacco or illicit drugs  C. Current medications and supplements D. Functional ability and status E.  Nutritional status F.  Physical activity G. Advance directives H. List of other physicians I.  Hospitalizations, surgeries, and ER visits in previous 12 months J.  Vitals K. Screenings such as hearing and vision if needed, cognitive and depression L. Referrals and appointments - none  In addition, I have reviewed and discussed with patient certain preventive protocols, quality metrics, and best practice recommendations. A written personalized care plan for preventive services as well as general preventive health recommendations were provided to patient.  See attached scanned questionnaire for additional information.   Signed,  Hyacinth Meeker, LPN Nurse Health Advisor   Nurse Recommendations: Pt declines the Hepatitis c lab, Prevnar 13 and influenza vaccines today.
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
## 2017-05-24 LAB — RENAL FUNCTION PANEL
Albumin: 4.3 g/dL (ref 3.6–5.1)
BUN/Creatinine Ratio: 14 (calc) (ref 6–22)
BUN: 14 mg/dL (ref 7–25)
CALCIUM: 9.9 mg/dL (ref 8.6–10.4)
CHLORIDE: 102 mmol/L (ref 98–110)
CO2: 26 mmol/L (ref 20–32)
Creat: 0.99 mg/dL — ABNORMAL HIGH (ref 0.60–0.93)
GLUCOSE: 91 mg/dL (ref 65–99)
POTASSIUM: 4 mmol/L (ref 3.5–5.3)
Phosphorus: 4.1 mg/dL (ref 2.1–4.3)
SODIUM: 140 mmol/L (ref 135–146)

## 2017-05-24 LAB — LIPID PANEL
CHOL/HDL RATIO: 5.3 (calc) — AB (ref <5.0)
Cholesterol: 303 mg/dL — ABNORMAL HIGH (ref <200)
HDL: 57 mg/dL (ref >50)
LDL CHOLESTEROL (CALC): 210 mg/dL — AB
NON-HDL CHOLESTEROL (CALC): 246 mg/dL — AB (ref <130)
TRIGLYCERIDES: 184 mg/dL — AB (ref <150)

## 2017-05-24 LAB — VITAMIN D 25 HYDROXY (VIT D DEFICIENCY, FRACTURES): Vit D, 25-Hydroxy: 54 ng/mL (ref 30–100)

## 2017-05-30 ENCOUNTER — Ambulatory Visit: Payer: Self-pay | Admitting: Family Medicine

## 2017-06-09 ENCOUNTER — Other Ambulatory Visit: Payer: Self-pay | Admitting: Family Medicine

## 2017-06-09 MED ORDER — HYDROCODONE-ACETAMINOPHEN 7.5-325 MG PO TABS: 1.0000 | ORAL_TABLET | Freq: Four times a day (QID) | ORAL | 0 refills | Status: DC | PRN

## 2017-06-09 NOTE — Telephone Encounter (Signed)
Pt contacted office for refill request on the following medications:  HYDROcodone-acetaminophen (NORCO) 7.5-325 MG tablet  [facility] [place]  Last Rx: 05/03/17 LOV: 05/23/17  Please advise. Thanks TNP

## 2017-06-14 ENCOUNTER — Telehealth: Payer: Self-pay | Admitting: Family Medicine
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
## 2017-06-18 ENCOUNTER — Other Ambulatory Visit: Payer: Self-pay | Admitting: Family Medicine

## 2017-06-19 ENCOUNTER — Other Ambulatory Visit: Payer: Self-pay | Admitting: Family Medicine

## 2017-06-21 NOTE — Telephone Encounter (Signed)
LMOVM for pt to return call 

## 2017-06-28 NOTE — Telephone Encounter (Signed)
Patient scheduled appt for 06/30/2017 at 4:00 pm.

## 2017-06-30 ENCOUNTER — Ambulatory Visit: Payer: Medicare Other | Admitting: Family Medicine

## 2017-06-30 DIAGNOSIS — E785 Hyperlipidemia, unspecified: Secondary | ICD-10-CM

## 2017-07-03 ENCOUNTER — Telehealth: Payer: Self-pay | Admitting: Family Medicine

## 2017-07-03 MED ORDER — EVOLOCUMAB 140 MG/ML ~~LOC~~ SOAJ: 140.0000 mg | SUBCUTANEOUS | 12 refills | Status: DC

## 2017-07-03 NOTE — Telephone Encounter (Signed)
Prescription only

## 2017-07-04 ENCOUNTER — Telehealth: Payer: Self-pay

## 2017-07-04 ENCOUNTER — Telehealth: Payer: Self-pay | Admitting: Family Medicine

## 2017-07-04 NOTE — Telephone Encounter (Signed)
Please advise 

## 2017-07-04 NOTE — Telephone Encounter (Signed)
Agree for her to take benadryl and zantac. Would also recommend OTC cetirizine once a day until rash cleared.

## 2017-07-04 NOTE — Telephone Encounter (Signed)
LMOVM for pt to return call 

## 2017-07-04 NOTE — Telephone Encounter (Signed)
Patient calls stating that she got Repatha injection on Friday on 06/30/17. Then the following day Saturday evening noticed a rash on the left side of her face-chin area, kept spreading and has it on her neck also. She applied Ammonium lactate cream her friend gave her and this has helped her rash from spreading more. Patient also has left eye swollen, she has been urinating very frequently since the injection and runny nose non stop. No SOB or chest tightness besides her chronic issue patient said. Discussed with Joycelyn ManJennifer Burnette, PA and advised patient per jennifer sounds like a allergic reaction to the injection and to take Benadryl oral or cream for the rash and Zantac OTC 150 mg daily until rash/symptosm resolve. Dr Sherrie MustacheFisher please advise, thank Neta Mendsyou-Crystalynn Mcinerney V Celsey Asselin, RMA

## 2017-07-04 NOTE — Telephone Encounter (Signed)
Patient states that she needs a prior authorization on the following medications   Armodafinil 250 MG tablet   Evolocumab (REPATHA SURECLICK) 140 MG/ML SOAJ  She has [employer] dual complete for her medication insurance.

## 2017-07-06 NOTE — Telephone Encounter (Signed)
LMOVM for pt to return call 

## 2017-07-07 ENCOUNTER — Telehealth: Payer: Self-pay | Admitting: Family Medicine

## 2017-07-07 NOTE — Telephone Encounter (Signed)
[facility] resolutions specalist is calling on an update of the Prior Auth Status for the Repatha 140MG .  States call back number is [phone] and whomever answers will be able to help.   RX number [id number] store number [phone]

## 2017-07-10 ENCOUNTER — Other Ambulatory Visit: Payer: Self-pay | Admitting: *Deleted

## 2017-07-10 NOTE — Telephone Encounter (Signed)
Please advise 

## 2017-07-10 NOTE — Telephone Encounter (Signed)
Please check with patient and see how she is doing since getting Repatha injection, and if she wants to continue medication, so we know whether to pursue prior authorization.

## 2017-07-10 NOTE — Telephone Encounter (Signed)
Patient stated that she is not going to take the Repatha.

## 2017-07-11 NOTE — Addendum Note (Signed)
Addended by: Malva LimesFISHER, DONALD E on: 07/11/2017 08:17 AM   Modules accepted: Orders

## 2017-07-11 NOTE — Telephone Encounter (Signed)
Repatha rx was canceled.

## 2017-07-11 NOTE — Telephone Encounter (Signed)
We discontinue repatha due to allergic reaction. Please call pharmacy to request information  for armodafinil PA

## 2017-07-11 NOTE — Telephone Encounter (Signed)
Please contact [facility] in [place] and cancel Repatha prescription. Thanks.

## 2017-07-11 NOTE — Telephone Encounter (Signed)
RX for Rapatha was canceled. PA request for armodafinil is being faxed to office.

## 2017-07-12 ENCOUNTER — Telehealth: Payer: Self-pay | Admitting: Family Medicine

## 2017-07-12 ENCOUNTER — Other Ambulatory Visit: Payer: Self-pay | Admitting: Family Medicine

## 2017-07-12 DIAGNOSIS — I10 Essential (primary) hypertension: Secondary | ICD-10-CM

## 2017-07-12 MED ORDER — HYDROCODONE-ACETAMINOPHEN 7.5-325 MG PO TABS: 1.0000 | ORAL_TABLET | Freq: Four times a day (QID) | ORAL | 0 refills | Status: DC | PRN

## 2017-07-12 NOTE — Telephone Encounter (Signed)
Patient requesting refill on her HYDROcodone-acetaminophen (NORCO) 7.5-325 MG tablet

## 2017-07-12 NOTE — Telephone Encounter (Signed)
Patient advised.

## 2017-07-19 ENCOUNTER — Telehealth: Payer: Self-pay | Admitting: Family Medicine

## 2017-07-19 NOTE — Telephone Encounter (Signed)
PA was done on 07/12/17 and was denied. Please advise?

## 2017-07-19 NOTE — Telephone Encounter (Signed)
Pt stated [facility] advised her our office needs to do a PA for Armodafinil 250 MG tablet. Pt stated she only has 2 left. Please advise. Thanks TNP

## 2017-07-20 DIAGNOSIS — E039 Hypothyroidism, unspecified: Secondary | ICD-10-CM | POA: Diagnosis not present

## 2017-07-24 NOTE — Telephone Encounter (Signed)
Pt stated that the insurance denied her PA for Armodafinil 250 MG tablet b/c we didn't include that she is taking the medication for sleep apnea and she had difficulty staying awake during the day due to her sleep apnea. Pt is requesting the we contact the insurance and re-do the PA. Please advise. Thanks TNP

## 2017-07-26 NOTE — Telephone Encounter (Signed)
PA was approved through to 05/2018. PA approval letter is being faxed to office.

## 2017-07-27 ENCOUNTER — Other Ambulatory Visit: Payer: Self-pay | Admitting: Family Medicine

## 2017-07-27 MED ORDER — FLUOXETINE HCL 20 MG PO CAPS: 60.0000 mg | ORAL_CAPSULE | Freq: Every day | ORAL | 4 refills | Status: DC

## 2017-07-27 NOTE — Telephone Encounter (Signed)
Pt is requesting refill on FLUoxetine (PROZAC) 20 MG capsule .She states she let pharmacy know this last week so they could send refill request.She is completely out at this time.[facility] [place]

## 2017-07-28 DIAGNOSIS — R5382 Chronic fatigue, unspecified: Secondary | ICD-10-CM | POA: Diagnosis not present

## 2017-07-28 DIAGNOSIS — E039 Hypothyroidism, unspecified: Secondary | ICD-10-CM | POA: Diagnosis not present

## 2017-07-30 ENCOUNTER — Other Ambulatory Visit: Payer: Self-pay | Admitting: Family Medicine

## 2017-08-25 ENCOUNTER — Other Ambulatory Visit: Payer: Self-pay | Admitting: Family Medicine

## 2017-08-25 MED ORDER — HYDROCODONE-ACETAMINOPHEN 7.5-325 MG PO TABS: 1.0000 | ORAL_TABLET | Freq: Four times a day (QID) | ORAL | 0 refills | Status: DC | PRN

## 2017-08-25 NOTE — Telephone Encounter (Signed)
Patient is requesting a refill on the following medication  HYDROcodone-acetaminophen (NORCO) 7.5-325 MG tablet  [place] [facility] [place].

## 2017-08-25 NOTE — Telephone Encounter (Signed)
Please review. Thanks!  

## 2017-09-26 ENCOUNTER — Other Ambulatory Visit: Payer: Self-pay

## 2017-09-26 ENCOUNTER — Other Ambulatory Visit: Payer: Self-pay | Admitting: Family Medicine

## 2017-09-26 NOTE — Telephone Encounter (Signed)
Patient reports that she is out of medication. Would you please refill to [facility] in [place]. Thanks!

## 2017-10-02 ENCOUNTER — Other Ambulatory Visit: Payer: Self-pay | Admitting: Family Medicine

## 2017-10-02 MED ORDER — HYDROCODONE-ACETAMINOPHEN 7.5-325 MG PO TABS: 1.0000 | ORAL_TABLET | Freq: Four times a day (QID) | ORAL | 0 refills | Status: DC | PRN

## 2017-10-02 NOTE — Telephone Encounter (Signed)
Pt needs a refill on her Hydrocodone 7.5-325  [place] [facility] [place]  Pt's call back is [phone]  Thanks teri

## 2017-10-16 ENCOUNTER — Other Ambulatory Visit: Payer: Self-pay | Admitting: Family Medicine

## 2017-10-26 ENCOUNTER — Other Ambulatory Visit: Payer: Self-pay | Admitting: Family Medicine

## 2017-10-26 NOTE — Telephone Encounter (Signed)
Pharmacy requesting refills. Thanks!  

## 2017-11-10 ENCOUNTER — Other Ambulatory Visit: Payer: Self-pay | Admitting: Family Medicine

## 2017-11-10 NOTE — Telephone Encounter (Signed)
Pt contacted office for refill request on the following medications:  HYDROcodone-acetaminophen (NORCO) 7.5-325 MG tablet  [facility] [place]  Last Rx: 10/02/17 LOV: 05/23/17 Please advise. Thanks TNP

## 2017-11-12 MED ORDER — HYDROCODONE-ACETAMINOPHEN 7.5-325 MG PO TABS: 1.0000 | ORAL_TABLET | Freq: Four times a day (QID) | ORAL | 0 refills | Status: DC | PRN

## 2017-12-07 ENCOUNTER — Other Ambulatory Visit: Payer: Self-pay | Admitting: Family Medicine

## 2017-12-12 ENCOUNTER — Other Ambulatory Visit: Payer: Self-pay | Admitting: Family Medicine

## 2017-12-12 NOTE — Telephone Encounter (Signed)
PT needs refill on her Hydrocodone 7.5/325  [place] [facility] [place]  Thanks teri

## 2017-12-12 NOTE — Telephone Encounter (Signed)
Please review. Thanks!  

## 2017-12-13 MED ORDER — HYDROCODONE-ACETAMINOPHEN 7.5-325 MG PO TABS: 1.0000 | ORAL_TABLET | Freq: Four times a day (QID) | ORAL | 0 refills | Status: DC | PRN

## 2018-01-19 LAB — HM DIABETES EYE EXAM

## 2018-01-25 ENCOUNTER — Other Ambulatory Visit: Payer: Self-pay

## 2018-01-25 MED ORDER — HYDROCODONE-ACETAMINOPHEN 7.5-325 MG PO TABS: 1.0000 | ORAL_TABLET | Freq: Four times a day (QID) | ORAL | 0 refills | Status: DC | PRN

## 2018-01-26 ENCOUNTER — Other Ambulatory Visit: Payer: Self-pay | Admitting: Family Medicine

## 2018-01-26 MED ORDER — FLUTICASONE PROPIONATE 50 MCG/ACT NA SUSP: NASAL | 5 refills | Status: DC

## 2018-01-26 NOTE — Telephone Encounter (Signed)
[facility] [place] faxed refill request for the following medications:  fluticasone (FLONASE) 50 MCG/ACT nasal spray   Last Rx: 05/23/16 LOV: 05/23/17 Please advise. Thanks TNP

## 2018-02-09 ENCOUNTER — Encounter: Payer: Self-pay | Admitting: *Deleted

## 2018-02-12 ENCOUNTER — Encounter: Payer: Self-pay | Admitting: Family Medicine

## 2018-02-27 ENCOUNTER — Other Ambulatory Visit: Payer: Self-pay | Admitting: Family Medicine

## 2018-02-27 NOTE — Telephone Encounter (Signed)
pt needs a refill on   Hydrocodone  7.5-325  [facility] [place]  CB#  [phone]  Thanks  [name]

## 2018-02-28 MED ORDER — HYDROCODONE-ACETAMINOPHEN 7.5-325 MG PO TABS: 1.0000 | ORAL_TABLET | Freq: Four times a day (QID) | ORAL | 0 refills | Status: DC | PRN

## 2018-03-26 DIAGNOSIS — M7541 Impingement syndrome of right shoulder: Secondary | ICD-10-CM | POA: Diagnosis not present

## 2018-03-26 DIAGNOSIS — M1711 Unilateral primary osteoarthritis, right knee: Secondary | ICD-10-CM | POA: Diagnosis not present

## 2018-03-30 ENCOUNTER — Other Ambulatory Visit: Payer: Self-pay | Admitting: Family Medicine

## 2018-03-30 MED ORDER — HYDROCODONE-ACETAMINOPHEN 7.5-325 MG PO TABS: 1.0000 | ORAL_TABLET | Freq: Four times a day (QID) | ORAL | 0 refills | Status: DC | PRN

## 2018-03-30 NOTE — Telephone Encounter (Signed)
Pt needing a refill on:  HYDROcodone-acetaminophen (NORCO) 7.5-325 MG tablet   Please fill at:   [facility] #[id number] [facility] [place] - [address] AT [facility] [address] [phone] (Phone) [phone]     Thanks, [facility]

## 2018-04-25 ENCOUNTER — Other Ambulatory Visit: Payer: Self-pay | Admitting: Family Medicine

## 2018-05-01 DIAGNOSIS — M25511 Pain in right shoulder: Secondary | ICD-10-CM | POA: Diagnosis not present

## 2018-05-01 DIAGNOSIS — M25561 Pain in right knee: Secondary | ICD-10-CM | POA: Diagnosis not present

## 2018-05-02 DIAGNOSIS — M25511 Pain in right shoulder: Secondary | ICD-10-CM | POA: Diagnosis not present

## 2018-05-02 DIAGNOSIS — M25561 Pain in right knee: Secondary | ICD-10-CM | POA: Diagnosis not present

## 2018-05-03 ENCOUNTER — Other Ambulatory Visit: Payer: Self-pay | Admitting: Family Medicine

## 2018-05-03 MED ORDER — HYDROCODONE-ACETAMINOPHEN 7.5-325 MG PO TABS: 1.0000 | ORAL_TABLET | Freq: Four times a day (QID) | ORAL | 0 refills | Status: DC | PRN

## 2018-05-03 NOTE — Telephone Encounter (Signed)
Please advise 

## 2018-05-03 NOTE — Telephone Encounter (Signed)
Pt needing refill on:  HYDROcodone-acetaminophen (NORCO) 7.5-325 MG tablet  Please fill at:  [id number] [facility] [place], [place] - [address] AT [facility] OF [address] [phone] (Phone) [phone])     Thanks, [facility]

## 2018-05-07 DIAGNOSIS — M25561 Pain in right knee: Secondary | ICD-10-CM | POA: Diagnosis not present

## 2018-05-07 DIAGNOSIS — M25511 Pain in right shoulder: Secondary | ICD-10-CM | POA: Diagnosis not present

## 2018-05-10 DIAGNOSIS — M25561 Pain in right knee: Secondary | ICD-10-CM | POA: Diagnosis not present

## 2018-05-10 DIAGNOSIS — M25511 Pain in right shoulder: Secondary | ICD-10-CM | POA: Diagnosis not present

## 2018-05-15 ENCOUNTER — Encounter: Payer: Self-pay | Admitting: Family Medicine

## 2018-05-15 ENCOUNTER — Ambulatory Visit (INDEPENDENT_AMBULATORY_CARE_PROVIDER_SITE_OTHER): Payer: Medicare Other | Admitting: Family Medicine

## 2018-05-15 ENCOUNTER — Other Ambulatory Visit: Payer: Self-pay

## 2018-05-15 ENCOUNTER — Ambulatory Visit: Payer: Medicare Other | Admitting: Family Medicine

## 2018-05-15 VITALS — BP 134/82 | HR 80 | Temp 98.1°F | Resp 98 | Ht 61.5 in | Wt 195.4 lb

## 2018-05-15 DIAGNOSIS — F5089 Other specified eating disorder: Secondary | ICD-10-CM | POA: Diagnosis not present

## 2018-05-15 DIAGNOSIS — R300 Dysuria: Secondary | ICD-10-CM | POA: Diagnosis not present

## 2018-05-15 DIAGNOSIS — K529 Noninfective gastroenteritis and colitis, unspecified: Secondary | ICD-10-CM

## 2018-05-15 LAB — POCT URINALYSIS DIPSTICK
Glucose, UA: NEGATIVE
Ketones, UA: NEGATIVE
NITRITE UA: NEGATIVE
PH UA: 6 (ref 5.0–8.0)
PROTEIN UA: NEGATIVE
SPEC GRAV UA: 1.025 (ref 1.010–1.025)
UROBILINOGEN UA: 0.2 U/dL

## 2018-05-15 NOTE — Progress Notes (Signed)
Patient: Morgan SlaughterCarolyn H Cerritos Female    DOB: [birth year]   72 y.o.   MRN: [id number] Visit Date: 05/15/2018  Today's Provider: Dortha Kernennis , PA   Chief Complaint  Patient presents with  . Urinary Urgency    burning with urination  . asthma attack    yesterday morning and lasted for a while   Subjective:    Urinary Frequency   This is a new problem. The current episode started in the past 7 days. The problem has been unchanged. The quality of the pain is described as burning. The pain is at a severity of 4/10. The pain is mild. There has been no fever. Associated symptoms include frequency, nausea, urgency and vomiting. Pertinent negatives include no discharge, flank pain, hematuria, hesitancy, possible pregnancy or sweats.      Past Medical History:  Diagnosis Date  . Anxiety   . Arthritis   . Cerebral artery occlusion with cerebral infarction (HCC)   . GERD (gastroesophageal reflux disease)   . Herniated disc, cervical   . History of chicken pox   . History of measles   . History of mumps   . Hyperlipidemia   . Hypertension   . OSA (obstructive sleep apnea)   . Osteopenia   . Thyroid disease    Past Surgical History:  Procedure Laterality Date  . ABDOMINAL HYSTERECTOMY     unilateral OOP  . AUGMENTATION MAMMAPLASTY  1975 &1992   removal of implants in 1992  . CAROTID DOPPLER ULTRASOUND  01/04/2013   No hemodynamically significant stenosis  . CHOLECYSTECTOMY    . DOPPLER ECHOCARDIOGRAPHY  04/04/2013   mild MVR and TR. mild LVH  . MRI BRAIN,BRAIN STEM  01/04/2013   Chronic Ischemic changes. Old lacunar infarct. Severe sinusitis  . MYOCARDIAL PERFUSION SCAN  04/05/2013   Normal  . pulmonary function test  07/21/2010   Normal; Done by Dr. Welton FlakesKhan  . SPINAL FUSION  1988   lower back  . TOTAL HIP ARTHROPLASTY Left 2006  . VULVAR LESION REMOVAL N/A 02/01/2017   Procedure: INCISION AND DRAINAGE VULVAR ABSCESS;  Surgeon: Feliberto GottronSchermerhorn, Ihor Austinhomas J, MD;  Location: [facility]  ORS;  Service: Gynecology;  Laterality: N/A;   Family History  Problem Relation Age of Onset  . Cancer Mother        female cancer (?)  . Pancreatic cancer Father   . Colon polyps Father   . Breast cancer Maternal Aunt    Allergies  Allergen Reactions  . Oysters  [Shellfish Allergy]   . Simvastatin     Muscle Aches  . Repatha [Evolocumab] Rash    Current Outpatient Medications:  .  Armodafinil 250 MG tablet, TAKE 1 TABLET BY MOUTH EVERY DAY, Disp: 30 tablet, Rfl: 5 .  aspirin 81 MG tablet, Take 81 mg by mouth daily. , Disp: , Rfl:  .  atenolol-chlorthalidone (TENORETIC) 50-25 MG tablet, TAKE 1/2 TABLET BY MOUTH EVERY DAY, Disp: 30 tablet, Rfl: 5 .  Biotin 1 MG CAPS, Take by mouth daily. , Disp: , Rfl:  .  diphenhydrAMINE (BENADRYL) 25 MG tablet, Take by mouth., Disp: , Rfl:  .  EPINEPHRINE 0.3 mg/0.3 mL IJ SOAJ injection, USE AS DIRECTED, Disp: 2 Device, Rfl: 2 .  fluticasone (FLONASE) 50 MCG/ACT nasal spray, SHAKE LIQUID AND USE 2 SPRAYS IN EACH NOSTRIL EVERY NIGHT AT BEDTIME, Disp: 16 g, Rfl: 5 .  HYDROcodone-acetaminophen (NORCO) 7.5-325 MG tablet, Take 1 tablet by mouth every 6 (six) hours as  needed for moderate pain., Disp: 120 tablet, Rfl: 0 .  ipratropium (ATROVENT) 0.06 % nasal spray, INHALE 2 SPRAYS IN EACH NOSTRIL THREE TIMES DAILY, Disp: 15 mL, Rfl: 12 .  montelukast (SINGULAIR) 10 MG tablet, TAKE 1 TABLET BY MOUTH EVERY DAY, Disp: 90 tablet, Rfl: 5 .  Multiple Vitamins-Minerals (CENTRUM SILVER 50+MEN) TABS, Take by mouth., Disp: , Rfl:  .  naproxen (NAPROSYN) 500 MG tablet, TAKE 1 TABLET BY MOUTH TWICE DAILY AS NEEDED, Disp: 180 tablet, Rfl: 1 .  omeprazole (PRILOSEC) 20 MG capsule, TAKE 1 CAPSULE BY MOUTH DAILY, Disp: 90 capsule, Rfl: 4 .  PRESCRIPTION MEDICATION, CPAP every night, Disp: , Rfl:  .  PROAIR HFA 108 (90 Base) MCG/ACT inhaler, USE 2 PUFFS BY INHALATION EVERY 6 HOURS AS NEEDED, Disp: 8.5 g, Rfl: 6 .  SYNTHROID 150 MCG tablet, TAKE 1 TABLET(150 MCG) BY MOUTH  DAILY BEFORE BREAKFAST, Disp: 30 tablet, Rfl: 6 .  tiZANidine (ZANAFLEX) 4 MG tablet, TAKE 1 TABLET BY MOUTH EVERY 8 HOURS AS NEEDED, Disp: 270 tablet, Rfl: 4 .  topiramate (TOPAMAX) 100 MG tablet, TAKE 1 TABLET BY MOUTH TWICE DAILY, Disp: 180 tablet, Rfl: 4 .  triamcinolone cream (KENALOG) 0.5 %, APPLY TOPICALLY UP TO THREE TIMES DAILY AS NEEDED, Disp: 30 g, Rfl: 0 .  FLUoxetine (PROZAC) 20 MG capsule, Take 3 capsules (60 mg total) by mouth daily. (Patient not taking: Reported on 05/15/2018), Disp: 270 capsule, Rfl: 4 .  Vitamin D, Ergocalciferol, (DRISDOL) 50000 UNITS CAPS capsule, Take 50,000 Units by mouth every 7 (seven) days. , Disp: , Rfl:   Review of Systems  Constitutional: Negative.   HENT: Negative.   Eyes: Negative.   Respiratory: Negative.   Cardiovascular: Negative.   Gastrointestinal: Positive for diarrhea, nausea and vomiting. Negative for abdominal distention, abdominal pain, anal bleeding, blood in stool, constipation and rectal pain.  Endocrine: Negative.   Genitourinary: Positive for dysuria, frequency and urgency. Negative for decreased urine volume, difficulty urinating (burning with urination), dyspareunia, enuresis, flank pain, genital sores, hematuria, hesitancy, menstrual problem, pelvic pain, vaginal bleeding, vaginal discharge and vaginal pain.  Musculoskeletal: Negative.   Skin: Negative.   Allergic/Immunologic: Negative.   Neurological: Negative.   Hematological: Negative.   Psychiatric/Behavioral: Negative.    Social History   Tobacco Use  . Smoking status: Former Smoker    Packs/day: 0.50    Years: 8.00    Pack years: 4.00    Types: Cigarettes  . Smokeless tobacco: Never Used  Substance Use Topics  . Alcohol use: No    Alcohol/week: 0.0 standard drinks   Objective:   BP 134/82 (BP Location: Right Arm, Patient Position: Sitting, Cuff Size: Normal)   Pulse 80   Temp 98.1 F (36.7 C) (Oral)   Resp (!) 98   Ht 5' 1.5" (1.562 m)   Wt 195 lb 6.4 oz  (88.6 kg)   BMI 36.32 kg/m   Wt Readings from Last 3 Encounters:  05/15/18 195 lb 6.4 oz (88.6 kg)  05/23/17 193 lb 6.4 oz (87.7 kg)  01/31/17 200 lb (90.7 kg)   Vitals:   05/15/18 1014  BP: 134/82  Pulse: 80  Resp: (!) 98  Temp: 98.1 F (36.7 C)  TempSrc: Oral  Weight: 195 lb 6.4 oz (88.6 kg)  Height: 5' 1.5" (1.562 m)   Physical Exam  Constitutional: She is oriented to person, place, and time. She appears well-developed and well-nourished. No distress.  HENT:  Head: Normocephalic and atraumatic.  Right Ear: Hearing  normal.  Left Ear: Hearing normal.  Nose: Nose normal.  Eyes: Conjunctivae and lids are normal. Right eye exhibits no discharge. Left eye exhibits no discharge. No scleral icterus.  Cardiovascular: Normal rate and regular rhythm.  Pulmonary/Chest: Effort normal and breath sounds normal. No respiratory distress.  Musculoskeletal: Normal range of motion.  Neurological: She is alert and oriented to person, place, and time.  Skin: Skin is intact. No lesion and no rash noted.  Psychiatric: She has a normal mood and affect. Her speech is normal and behavior is normal. Thought content normal.      Assessment & Plan:     1. Burning with urination Onset over the past few days with frequency and burning at the end of urination. No gross hematuria. Urinalysis shows some blood and leukocytes on dip stick. No fever or CVA tenderness to percussion. Encouraged to drink extra fluids and will get urine C&S. Don't want to take any antibiotics due to recent stomach upset. - POCT Urinalysis Dipstick - Urine Culture  2. Gastroenteritis Had a 24-36 hour bout of nausea and diarrhea with some stomach cramps. Used Pepto-Bismol with all symptoms calming down. Will check CBC for sign of infection or dehydration. Continue bland diet with increased fluid intake. Recheck pending report. - CBC with Differential/Platelet  3. Pica Has been having cravings to eat ice. No hematemesis or  melena. Will check CBC for sign of anemia. - CBC with Differential/Platelet       Dortha Kern, PA  [facility]

## 2018-05-16 LAB — CBC WITH DIFFERENTIAL/PLATELET
BASOS ABS: 0.1 10*3/uL (ref 0.0–0.2)
BASOS: 1 %
EOS (ABSOLUTE): 0.3 10*3/uL (ref 0.0–0.4)
Eos: 4 %
Hematocrit: 35.8 % (ref 34.0–46.6)
Hemoglobin: 12.1 g/dL (ref 11.1–15.9)
IMMATURE GRANULOCYTES: 0 %
Immature Grans (Abs): 0 10*3/uL (ref 0.0–0.1)
Lymphocytes Absolute: 2.9 10*3/uL (ref 0.7–3.1)
Lymphs: 33 %
MCH: 32.3 pg (ref 26.6–33.0)
MCHC: 33.8 g/dL (ref 31.5–35.7)
MCV: 96 fL (ref 79–97)
MONOS ABS: 0.6 10*3/uL (ref 0.1–0.9)
Monocytes: 7 %
NEUTROS PCT: 55 %
Neutrophils Absolute: 5 10*3/uL (ref 1.4–7.0)
PLATELETS: 308 10*3/uL (ref 150–450)
RBC: 3.75 x10E6/uL — ABNORMAL LOW (ref 3.77–5.28)
RDW: 12.1 % — AB (ref 12.3–15.4)
WBC: 8.9 10*3/uL (ref 3.4–10.8)

## 2018-05-17 LAB — URINE CULTURE

## 2018-05-18 ENCOUNTER — Telehealth: Payer: Self-pay

## 2018-05-18 NOTE — Telephone Encounter (Signed)
-----   Message from Tamsen Roersennis E Chrismon, [place] sent at 05/18/2018  8:37 AM EST ----- No sign of anemia, infection in blood counts or urine culture. Drink plenty of fluids and may use AZO-Standard (OTC medication) if needed for urinary burning or frequency. Recheck if no better in 5-7 days.

## 2018-05-18 NOTE — Telephone Encounter (Signed)
Left detailed message on vm about lab resuts and told to be rechecked in 5-7 days if no better.  Also advised to call back if any questions or concerns.  dbs

## 2018-05-21 ENCOUNTER — Other Ambulatory Visit: Payer: Self-pay | Admitting: Family Medicine

## 2018-05-21 DIAGNOSIS — Z1231 Encounter for screening mammogram for malignant neoplasm of breast: Secondary | ICD-10-CM

## 2018-05-21 DIAGNOSIS — M25561 Pain in right knee: Secondary | ICD-10-CM | POA: Diagnosis not present

## 2018-05-21 DIAGNOSIS — M25511 Pain in right shoulder: Secondary | ICD-10-CM | POA: Diagnosis not present

## 2018-05-24 ENCOUNTER — Ambulatory Visit (INDEPENDENT_AMBULATORY_CARE_PROVIDER_SITE_OTHER): Payer: Medicare Other | Admitting: Family Medicine

## 2018-05-24 ENCOUNTER — Other Ambulatory Visit: Payer: Self-pay | Admitting: Family Medicine

## 2018-05-24 ENCOUNTER — Ambulatory Visit (INDEPENDENT_AMBULATORY_CARE_PROVIDER_SITE_OTHER): Payer: Medicare Other

## 2018-05-24 VITALS — BP 136/64 | HR 70 | Temp 98.5°F | Ht 61.0 in | Wt 197.0 lb

## 2018-05-24 DIAGNOSIS — Z0000 Encounter for general adult medical examination without abnormal findings: Secondary | ICD-10-CM

## 2018-05-24 DIAGNOSIS — E039 Hypothyroidism, unspecified: Secondary | ICD-10-CM

## 2018-05-24 DIAGNOSIS — E7801 Familial hypercholesterolemia: Secondary | ICD-10-CM

## 2018-05-24 DIAGNOSIS — I10 Essential (primary) hypertension: Secondary | ICD-10-CM | POA: Diagnosis not present

## 2018-05-24 DIAGNOSIS — Z23 Encounter for immunization: Secondary | ICD-10-CM

## 2018-05-24 DIAGNOSIS — Z1211 Encounter for screening for malignant neoplasm of colon: Secondary | ICD-10-CM | POA: Diagnosis not present

## 2018-05-24 DIAGNOSIS — M5417 Radiculopathy, lumbosacral region: Secondary | ICD-10-CM

## 2018-05-24 DIAGNOSIS — M25561 Pain in right knee: Secondary | ICD-10-CM | POA: Diagnosis not present

## 2018-05-24 DIAGNOSIS — I2510 Atherosclerotic heart disease of native coronary artery without angina pectoris: Secondary | ICD-10-CM

## 2018-05-24 DIAGNOSIS — M25511 Pain in right shoulder: Secondary | ICD-10-CM | POA: Diagnosis not present

## 2018-05-24 DIAGNOSIS — E559 Vitamin D deficiency, unspecified: Secondary | ICD-10-CM

## 2018-05-24 MED ORDER — NAPROXEN 500 MG PO TABS: 500.0000 mg | ORAL_TABLET | Freq: Two times a day (BID) | ORAL | 1 refills | Status: DC | PRN

## 2018-05-24 MED ORDER — ROSUVASTATIN CALCIUM 5 MG PO TABS: 5.0000 mg | ORAL_TABLET | Freq: Every day | ORAL | 3 refills | Status: DC

## 2018-05-24 MED ORDER — ATENOLOL-CHLORTHALIDONE 50-25 MG PO TABS: 0.5000 | ORAL_TABLET | Freq: Every day | ORAL | 5 refills | Status: DC

## 2018-05-24 NOTE — Patient Instructions (Addendum)
Morgan Lynch , Thank you for taking time to come for your Medicare Wellness Visit. I appreciate your ongoing commitment to your health goals. Please review the following plan we discussed and let me know if I can assist you in the future.   Screening recommendations/referrals: Colonoscopy: Referral sent today.  Mammogram: Scheduled for 05/30/18 Bone Density: Up to date, due 05/2019 Recommended yearly ophthalmology/optometry visit for glaucoma screening and checkup Recommended yearly dental visit for hygiene and checkup  Vaccinations: Influenza vaccine: Pt declines today.  Pneumococcal vaccine: Pt declines today.  Tdap vaccine: Up to date Shingles vaccine: Pt declines today.     Advanced directives: Advance directive discussed with you today. Even though you declined this today please call our office should you change your mind and we can give you the proper paperwork for you to fill out.  Conditions/risks identified: Obesity- Continue cutting out all junk foods and avoid sugars in daily diet to help aid in weight loss.   Next appointment: 10:40 AM today with Dr Sherrie MustacheFisher. Declined scheduling AWV for 2020 at this time.    Preventive Care 4565 Years and Older, Female Preventive care refers to lifestyle choices and visits with your health care provider that can promote health and wellness. What does preventive care include?  A yearly physical exam. This is also called an annual well check.  Dental exams once or twice a year.  Routine eye exams. Ask your health care provider how often you should have your eyes checked.  Personal lifestyle choices, including:  Daily care of your teeth and gums.  Regular physical activity.  Eating a healthy diet.  Avoiding tobacco and drug use.  Limiting alcohol use.  Practicing safe sex.  Taking low-dose aspirin every day.  Taking vitamin and mineral supplements as recommended by your health care provider. What happens during an annual well  check? The services and screenings done by your health care provider during your annual well check will depend on your age, overall health, lifestyle risk factors, and family history of disease. Counseling  Your health care provider may ask you questions about your:  Alcohol use.  Tobacco use.  Drug use.  Emotional well-being.  Home and relationship well-being.  Sexual activity.  Eating habits.  History of falls.  Memory and ability to understand (cognition).  Work and work Astronomerenvironment.  Reproductive health. Screening  You may have the following tests or measurements:  Height, weight, and BMI.  Blood pressure.  Lipid and cholesterol levels. These may be checked every 5 years, or more frequently if you are over 73 years old.  Skin check.  Lung cancer screening. You may have this screening every year starting at age 73 if you have a 30-pack-year history of smoking and currently smoke or have quit within the past 15 years.  Fecal occult blood test (FOBT) of the stool. You may have this test every year starting at age 73.  Flexible sigmoidoscopy or colonoscopy. You may have a sigmoidoscopy every 5 years or a colonoscopy every 10 years starting at age 73.  Hepatitis C blood test.  Hepatitis B blood test.  Sexually transmitted disease (STD) testing.  Diabetes screening. This is done by checking your blood sugar (glucose) after you have not eaten for a while (fasting). You may have this done every 1-3 years.  Bone density scan. This is done to screen for osteoporosis. You may have this done starting at age 73.  Mammogram. This may be done every 1-2 years. Talk to your  health care provider about how often you should have regular mammograms. Talk with your health care provider about your test results, treatment options, and if necessary, the need for more tests. Vaccines  Your health care provider may recommend certain vaccines, such as:  Influenza vaccine. This is  recommended every year.  Tetanus, diphtheria, and acellular pertussis (Tdap, Td) vaccine. You may need a Td booster every 10 years.  Zoster vaccine. You may need this after age 44.  Pneumococcal 13-valent conjugate (PCV13) vaccine. One dose is recommended after age 53.  Pneumococcal polysaccharide (PPSV23) vaccine. One dose is recommended after age 26. Talk to your health care provider about which screenings and vaccines you need and how often you need them. This information is not intended to replace advice given to you by your health care provider. Make sure you discuss any questions you have with your health care provider. Document Released: 07/03/2015 Document Revised: 02/24/2016 Document Reviewed: 04/07/2015 Elsevier Interactive Patient Education  2017 [place] Prevention in the Home Falls can cause injuries. They can happen to people of all ages. There are many things you can do to make your home safe and to help prevent falls. What can I do on the outside of my home?  Regularly fix the edges of walkways and driveways and fix any cracks.  Remove anything that might make you trip as you walk through a door, such as a raised step or threshold.  Trim any bushes or trees on the path to your home.  Use bright outdoor lighting.  Clear any walking paths of anything that might make someone trip, such as rocks or tools.  Regularly check to see if handrails are loose or broken. Make sure that both sides of any steps have handrails.  Any raised decks and porches should have guardrails on the edges.  Have any leaves, snow, or ice cleared regularly.  Use sand or salt on walking paths during winter.  Clean up any spills in your garage right away. This includes oil or grease spills. What can I do in the bathroom?  Use night lights.  Install grab bars by the toilet and in the tub and shower. Do not use towel bars as grab bars.  Use non-skid mats or decals in the tub or  shower.  If you need to sit down in the shower, use a plastic, non-slip stool.  Keep the floor dry. Clean up any water that spills on the floor as soon as it happens.  Remove soap buildup in the tub or shower regularly.  Attach bath mats securely with double-sided non-slip rug tape.  Do not have throw rugs and other things on the floor that can make you trip. What can I do in the bedroom?  Use night lights.  Make sure that you have a light by your bed that is easy to reach.  Do not use any sheets or blankets that are too big for your bed. They should not hang down onto the floor.  Have a firm chair that has side arms. You can use this for support while you get dressed.  Do not have throw rugs and other things on the floor that can make you trip. What can I do in the kitchen?  Clean up any spills right away.  Avoid walking on wet floors.  Keep items that you use a lot in easy-to-reach places.  If you need to reach something above you, use a strong step stool that has a  grab bar.  Keep electrical cords out of the way.  Do not use floor polish or wax that makes floors slippery. If you must use wax, use non-skid floor wax.  Do not have throw rugs and other things on the floor that can make you trip. What can I do with my stairs?  Do not leave any items on the stairs.  Make sure that there are handrails on both sides of the stairs and use them. Fix handrails that are broken or loose. Make sure that handrails are as long as the stairways.  Check any carpeting to make sure that it is firmly attached to the stairs. Fix any carpet that is loose or worn.  Avoid having throw rugs at the top or bottom of the stairs. If you do have throw rugs, attach them to the floor with carpet tape.  Make sure that you have a light switch at the top of the stairs and the bottom of the stairs. If you do not have them, ask someone to add them for you. What else can I do to help prevent  falls?  Wear shoes that:  Do not have high heels.  Have rubber bottoms.  Are comfortable and fit you well.  Are closed at the toe. Do not wear sandals.  If you use a stepladder:  Make sure that it is fully opened. Do not climb a closed stepladder.  Make sure that both sides of the stepladder are locked into place.  Ask someone to hold it for you, if possible.  Clearly mark and make sure that you can see:  Any grab bars or handrails.  First and last steps.  Where the edge of each step is.  Use tools that help you move around (mobility aids) if they are needed. These include:  Canes.  Walkers.  Scooters.  Crutches.  Turn on the lights when you go into a dark area. Replace any light bulbs as soon as they burn out.  Set up your furniture so you have a clear path. Avoid moving your furniture around.  If any of your floors are uneven, fix them.  If there are any pets around you, be aware of where they are.  Review your medicines with your doctor. Some medicines can make you feel dizzy. This can increase your chance of falling. Ask your doctor what other things that you can do to help prevent falls. This information is not intended to replace advice given to you by your health care provider. Make sure you discuss any questions you have with your health care provider. Document Released: 04/02/2009 Document Revised: 11/12/2015 Document Reviewed: 07/11/2014 Elsevier Interactive Patient Education  2017 [employer].

## 2018-05-24 NOTE — Telephone Encounter (Signed)
[facility] faxed refill request for the following medications:  atenolol-chlorthalidone (TENORETIC) 50-25 MG tablet   90 day supply  Please advise. Thanks TNP

## 2018-05-24 NOTE — Progress Notes (Signed)
Patient: Morgan SlaughterCarolyn H Goding, Female    DOB: 07-10-1945, 72 y.o.   MRN: [id number] Visit Date: 05/24/2018  Today's Provider: Mila Merryonald Fisher, MD   Chief Complaint  Patient presents with  . Annual Exam   Subjective:     Complete Physical Morgan SlaughterCarolyn H Balazs is a 72 y.o. female. She feels fairly well.  Still having trouble with back pain, but is currently doing physical therapy.   She reports exercising regularly. She reports she is sleeping poorly.  Pt reports this is a chronic issue.    ----------------------------------------------------------- Hyperlipidemia  Associated symptoms include myalgias and shortness of breath. Pertinent negatives include no chest pain.  Hypertension  This is a chronic problem. The problem is unchanged. The problem is controlled (Pt reports she does not take her BP medications on a regular basis.  Only when she remembers, but pt states her BP at home are normal.). Associated symptoms include headaches, malaise/fatigue, neck pain and shortness of breath. Pertinent negatives include no anxiety, blurred vision, chest pain, orthopnea, palpitations, peripheral edema, PND or sweats. There are no associated agents to hypertension.   She tried Repatha last year which she reports caused severe allergic reaction including rash, throat swelling and shortness of breath requiring her to use her Epipen. She has muscles and apparently some cognitive side effects from simvastatin and has refused trial of any other statins.   Lab Results  Component Value Date   CHOL 303 (H) 05/23/2017   HDL 57 05/23/2017   LDLCALC 210 (H) 05/23/2017   TRIG 184 (H) 05/23/2017   CHOLHDL 5.3 (H) 05/23/2017     Review of Systems  Constitutional: Positive for chills, diaphoresis, fatigue and malaise/fatigue. Negative for activity change, appetite change, fever and unexpected weight change.  HENT: Positive for congestion, ear pain, mouth sores, postnasal drip, rhinorrhea, sinus pressure,  sinus pain, sneezing and tinnitus. Negative for dental problem, drooling, ear discharge, facial swelling, hearing loss, nosebleeds, sore throat, trouble swallowing and voice change.   Eyes: Positive for photophobia, discharge and itching. Negative for blurred vision, pain, redness and visual disturbance.  Respiratory: Positive for cough and shortness of breath. Negative for apnea, choking, chest tightness, wheezing and stridor.   Cardiovascular: Negative.  Negative for chest pain, palpitations, orthopnea and PND.  Gastrointestinal: Positive for abdominal pain, constipation, diarrhea and nausea. Negative for abdominal distention, anal bleeding, blood in stool, rectal pain and vomiting.  Endocrine: Positive for polydipsia and polyuria. Negative for cold intolerance, heat intolerance and polyphagia.  Genitourinary: Positive for dysuria and enuresis. Negative for decreased urine volume, difficulty urinating, dyspareunia, flank pain, frequency, genital sores, hematuria, menstrual problem, pelvic pain, urgency, vaginal bleeding, vaginal discharge and vaginal pain.  Musculoskeletal: Positive for arthralgias, back pain, gait problem, myalgias, neck pain and neck stiffness. Negative for joint swelling.  Skin: Negative.   Allergic/Immunologic: Positive for environmental allergies and food allergies. Negative for immunocompromised state.  Neurological: Positive for numbness and headaches. Negative for dizziness, tremors, seizures, syncope, facial asymmetry, speech difficulty, weakness and light-headedness.  Hematological: Negative for adenopathy. Bruises/bleeds easily.  Psychiatric/Behavioral: Positive for sleep disturbance. Negative for agitation, behavioral problems, confusion, decreased concentration, dysphoric mood, hallucinations, self-injury and suicidal ideas. The patient is not nervous/anxious and is not hyperactive.     Social History   Socioeconomic History  . Marital status: Single    Spouse name:  Not on file  . Number of children: 5  . Years of education: Not on file  . Highest education level: Some  college, no degree  Occupational History  . Occupation: Disabled  Social Needs  . Financial resource strain: Somewhat hard  . Food insecurity:    Worry: Never true    Inability: Never true  . Transportation needs:    Medical: No    Non-medical: No  Tobacco Use  . Smoking status: Former Smoker    Packs/day: 0.50    Years: 8.00    Pack years: 4.00    Types: Cigarettes  . Smokeless tobacco: Never Used  . Tobacco comment: quit smoking in 1980s  Substance and Sexual Activity  . Alcohol use: No    Alcohol/week: 0.0 standard drinks  . Drug use: Yes    Types: Marijuana    Comment: uses marijuana  . Sexual activity: Not on file  Lifestyle  . Physical activity:    Days per week: 0 days    Minutes per session: 0 min  . Stress: Rather much  Relationships  . Social connections:    Talks on phone: Patient refused    Gets together: Patient refused    Attends religious service: Patient refused    Active member of club or organization: Patient refused    Attends meetings of clubs or organizations: Patient refused    Relationship status: Patient refused  . Intimate partner violence:    Fear of current or ex partner: Patient refused    Emotionally abused: Patient refused    Physically abused: Patient refused    Forced sexual activity: Patient refused  Other Topics Concern  . Not on file  Social History Narrative  . Not on file    Past Medical History:  Diagnosis Date  . Anxiety   . Arthritis   . Cerebral artery occlusion with cerebral infarction (HCC)   . GERD (gastroesophageal reflux disease)   . Herniated disc, cervical   . History of chicken pox   . History of measles   . History of mumps   . Hyperlipidemia   . Hypertension   . OSA (obstructive sleep apnea)   . Osteopenia   . Thyroid disease      Patient Active Problem List   Diagnosis Date Noted  . Vulvar  abscess 01/31/2017  . Cervical spondylosis with myelopathy 10/25/2016  . Right-sided Bell's palsy 05/20/2016  . Reaction, situational, acute, to stress 05/20/2016  . Adiposity 11/12/2015  . Fibromyalgia 10/29/2015  . Chronic pain 10/29/2015  . Clinical depression 10/29/2015  . Change in bowel function 03/04/2015  . Vitamin D deficiency 03/04/2015  . Obesity 03/04/2015  . Hypersomnia 03/04/2015  . Allergic rhinitis 03/04/2015  . GERD (gastroesophageal reflux disease) 03/04/2015  . Degenerative arthritis of spine 03/04/2015  . Facial numbness 03/04/2015  . Flatulence 03/04/2015  . History of TIA (transient ischemic attack) 08/14/2014  . Osteopenia 05/22/2014  . Malaise and fatigue 02/23/2010  . Alopecia 12/09/2009  . Constipation 02/29/2008  . CAD (coronary artery disease) 05/09/2007  . Edema 03/22/2007  . DDD (degenerative disc disease), lumbar 12/09/2006  . Cerebral artery occlusion with cerebral infarction (HCC) 12/09/2006  . Anxiety state 11/01/2006  . Hypothyroidism 07/30/2006  . Familial hypercholesteremia 07/30/2006  . Obstructive sleep apnea 07/30/2006  . Hypertension 07/30/2006  . 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 History of colon polyps 07/30/2006  . Restless leg 06/20/2004  . Muscle ache 06/20/1998    Past Surgical History:  Procedure Laterality Date  . ABDOMINAL HYSTERECTOMY     unilateral OOP  . AUGMENTATION MAMMAPLASTY  1975 &1992   removal of implants in  1992  . CAROTID DOPPLER ULTRASOUND  01/04/2013   No hemodynamically significant stenosis  . CHOLECYSTECTOMY    . DOPPLER ECHOCARDIOGRAPHY  04/04/2013   mild MVR and TR. mild LVH  . MRI BRAIN,BRAIN STEM  01/04/2013   Chronic Ischemic changes. Old lacunar infarct. Severe sinusitis  . MYOCARDIAL PERFUSION SCAN  04/05/2013   Normal  . pulmonary function test  07/21/2010   Normal; Done by Dr. Welton Flakes  . SPINAL FUSION  1988   lower back  . TOTAL HIP ARTHROPLASTY Left 2006  . VULVAR LESION REMOVAL N/A  02/01/2017   Procedure: INCISION AND DRAINAGE VULVAR ABSCESS;  Surgeon: Feliberto Gottron, Ihor Austin, MD;  Location: [facility] ORS;  Service: Gynecology;  Laterality: N/A;    Her family history includes Breast cancer in her maternal aunt; Cancer in her mother; Colon polyps in her father; Pancreatic cancer in her father.      Current Outpatient Medications:  .  Armodafinil 250 MG tablet, TAKE 1 TABLET BY MOUTH EVERY DAY, Disp: 30 tablet, Rfl: 5 .  aspirin 81 MG tablet, Take 81 mg by mouth daily. , Disp: , Rfl:  .  atenolol-chlorthalidone (TENORETIC) 50-25 MG tablet, TAKE 1/2 TABLET BY MOUTH EVERY DAY, Disp: 30 tablet, Rfl: 5 .  Biotin 1 MG CAPS, Take by mouth daily. , Disp: , Rfl:  .  diphenhydrAMINE (BENADRYL) 25 MG tablet, Take by mouth., Disp: , Rfl:  .  EPINEPHrine (EPIPEN 2-PAK) 0.3 mg/0.3 mL IJ SOAJ injection, Inject into the muscle as needed. , Disp: , Rfl:  .  EPINEPHRINE 0.3 mg/0.3 mL IJ SOAJ injection, USE AS DIRECTED, Disp: 2 Device, Rfl: 2 .  FLUoxetine (PROZAC) 20 MG capsule, Take 3 capsules (60 mg total) by mouth daily., Disp: 270 capsule, Rfl: 4 .  fluticasone (FLONASE) 50 MCG/ACT nasal spray, SHAKE LIQUID AND USE 2 SPRAYS IN EACH NOSTRIL EVERY NIGHT AT BEDTIME, Disp: 16 g, Rfl: 5 .  HYDROcodone-acetaminophen (NORCO) 7.5-325 MG tablet, Take 1 tablet by mouth every 6 (six) hours as needed for moderate pain., Disp: 120 tablet, Rfl: 0 .  ipratropium (ATROVENT) 0.06 % nasal spray, INHALE 2 SPRAYS IN EACH NOSTRIL THREE TIMES DAILY, Disp: 15 mL, Rfl: 12 .  Magnesium 500 MG CAPS, Take by mouth 2 (two) times daily. , Disp: , Rfl:  .  montelukast (SINGULAIR) 10 MG tablet, TAKE 1 TABLET BY MOUTH EVERY DAY, Disp: 90 tablet, Rfl: 5 .  Multiple Vitamins-Minerals (CENTRUM SILVER 50+MEN) TABS, Take by mouth., Disp: , Rfl:  .  naproxen (NAPROSYN) 500 MG tablet, TAKE 1 TABLET BY MOUTH TWICE DAILY AS NEEDED, Disp: 180 tablet, Rfl: 1 .  nystatin (MYCOSTATIN) 100000 UNIT/ML suspension, as needed. , Disp: , Rfl:   .  omeprazole (PRILOSEC) 20 MG capsule, TAKE 1 CAPSULE BY MOUTH DAILY, Disp: 90 capsule, Rfl: 4 .  PRESCRIPTION MEDICATION, CPAP every night, Disp: , Rfl:  .  PROAIR HFA 108 (90 Base) MCG/ACT inhaler, USE 2 PUFFS BY INHALATION EVERY 6 HOURS AS NEEDED, Disp: 8.5 g, Rfl: 6 .  SYNTHROID 150 MCG tablet, TAKE 1 TABLET(150 MCG) BY MOUTH DAILY BEFORE BREAKFAST, Disp: 30 tablet, Rfl: 6 .  tiZANidine (ZANAFLEX) 4 MG tablet, TAKE 1 TABLET BY MOUTH EVERY 8 HOURS AS NEEDED, Disp: 270 tablet, Rfl: 4 .  topiramate (TOPAMAX) 100 MG tablet, TAKE 1 TABLET BY MOUTH TWICE DAILY, Disp: 180 tablet, Rfl: 4 .  triamcinolone cream (KENALOG) 0.5 %, APPLY TOPICALLY UP TO THREE TIMES DAILY AS NEEDED, Disp: 30 g, Rfl: 0 .  Vitamin D, Ergocalciferol, (DRISDOL) 50000 UNITS CAPS capsule, Take 50,000 Units by mouth every 7 (seven) days. , Disp: , Rfl:   Patient Care Team: Malva Limes, MD as PCP - General (Family Medicine) Rana Snare, MD as Consulting Physician (Internal Medicine)     Objective:   Vitals:   BP 136/64 (BP Location: Right Arm)   Pulse 70   Temp 98.5 F (36.9 C) (Oral)   Ht 5\' 1"  (1.549 m)   Wt 197 lb (89.4 kg)   BMI 37.22 kg/m   BSA 1.96 m   Pain [place] 8       Physical Exam   General Appearance:    Alert, cooperative, no distress, appears stated age  Head:    Normocephalic, without obvious abnormality, atraumatic  Eyes:    PERRL, conjunctiva/corneas clear, EOM's intact, fundi    benign, both eyes  Ears:    Normal TM's and external ear canals, both ears  Nose:   Nares normal, septum midline, mucosa normal, no drainage    or sinus tenderness  Throat:   Lips, mucosa, and tongue normal; teeth and gums normal  Neck:   Supple, symmetrical, trachea midline, no adenopathy;    thyroid:  no enlargement/tenderness/nodules; no carotid   bruit or JVD  Back:     Symmetric, no curvature, ROM normal, no CVA tenderness  Lungs:     Clear to auscultation bilaterally, respirations unlabored  Chest  Wall:    No tenderness or deformity   Heart:    Regular rate and rhythm, S1 and S2 normal, no murmur, rub   or gallop  Breast Exam:    patient refused  Abdomen:     Soft, non-tender, bowel sounds active all four quadrants,    no masses, no organomegaly  Pelvic:    not indicated; status post hysterectomy, negative ROS  Extremities:   Extremities normal, atraumatic, no cyanosis or edema  Pulses:   2+ and symmetric all extremities  Skin:   Skin color, texture, turgor normal, no rashes or lesions  Lymph nodes:   Cervical, supraclavicular, and axillary nodes normal  Neurologic:   CNII-XII intact, normal strength, sensation and reflexes    throughout    Activities of Daily Living In your present state of health, do you have any difficulty performing the following activities: 05/24/2018  Hearing? N  Vision? Y  Comment Due to cataract on right eye. Wears eye glasses.   Difficulty concentrating or making decisions? N  Walking or climbing stairs? Y  Dressing or bathing? N  Doing errands, shopping? N  Preparing Food and eating ? N  Using the Toilet? N  In the past six months, have you accidently leaked urine? N  Do you have problems with loss of bowel control? N  Managing your Medications? N  Managing your Finances? N  Housekeeping or managing your Housekeeping? N  Some recent data might be hidden    Fall Risk Assessment Fall Risk  05/24/2018 05/15/2018 05/23/2017 02/24/2016  Falls in the past year? 1 1 Yes No  Number falls in past yr: 1 0 1 -  Injury with Fall? 0 0 - -  Follow up Falls prevention discussed - Falls prevention discussed -     Depression Screen PHQ 2/9 Scores 05/24/2018 05/15/2018 05/23/2017 02/24/2016  PHQ - 2 Score 0 0 0 0  PHQ- 9 Score - - 4 -      Assessment & Plan:    Annual Physical Reviewed patient's Family Medical History  Reviewed and updated list of patient's medical providers Assessment of cognitive impairment was done Assessed patient's functional  ability Established a written schedule for health screening services Health Risk Assessent Completed and Reviewed  Exercise Activities and Dietary recommendations Goals    . Cut out extra servings     Recommend to continue current diet regimen of cutting out sweets and junk food.        Immunization History  Administered Date(s) Administered  . Pneumococcal Polysaccharide-23 03/23/2012  . Tdap 03/23/2012  . Zoster 03/23/2012    Health Maintenance  Topic Date Due  . Hepatitis C Screening  03/15/1946  . PNA vac Low Risk Adult (2 of 2 - PCV13) 03/23/2013  . COLONOSCOPY  05/23/2016  . INFLUENZA VACCINE  09/18/2018 (Originally 01/18/2018)  . MAMMOGRAM  05/19/2019  . DEXA SCAN  05/23/2019  . TETANUS/TDAP  03/23/2022     Discussed health benefits of physical activity, and encouraged her to engage in regular exercise appropriate for her age and condition.    ------------------------------------------------------------------------------------------------------------  1. Annual physical exam She is scheduled for mammogram, but refuses breast exam. She refused flu vaccine today. Has had coloscopy referral ordered.   2. Coronary artery disease involving native coronary artery of native heart without angina pectoris Currently asymptomatic. Extensively counseled on association of hyperlipidemia, stroke, and heart disease. She is very reluctant to try another statin, but did agree to try low dose of rosuvastatin and is to continue on daily ECASA.   3. Need for prophylactic vaccination using tetanus and diphtheria toxoids adsorbed (Td) vaccine Td  4. Need for pneumococcal vaccination Prevnar-13  5. Lumbosacral neuritis Doing well with prn hydrocodone/apap   6. Hypothyroidism, unspecified type  - TSH  7. Vitamin D deficiency  - VITAMIN D 25 Hydroxy (Vit-D Deficiency, Fractures)  8. Familial hypercholesteremia  - Lipid panel - Comprehensive metabolic panel - rosuvastatin  (CRESTOR) 5 MG tablet; Take 1 tablet (5 mg total) by mouth daily.  Dispense: 30 tablet; Refill: 3  9. Essential hypertension Stable, but not taking atenolol-chlorthalidone consistently . Advised she needs to take this every day.  - EKG 12-Lead   Mila Merry, MD  [facility]

## 2018-05-24 NOTE — Patient Instructions (Addendum)
   The CDC recommends two doses of Shingrix (the shingles vaccine) separated by 2 to 6 months for adults age 73 years and older. I recommend checking with your insurance plan regarding coverage for this vaccine.    Be sure to take atenolol-chlorthalidone every day   Call my office if you have any problems with the Crestor 5mg  tablets.

## 2018-05-24 NOTE — Progress Notes (Signed)
Subjective:   Morgan SlaughterCarolyn H Lynch is a 72 y.o. female who presents for Medicare Annual (Subsequent) preventive examination.  Review of Systems:  N/A  Cardiac Risk Factors include: advanced age (>7555men, 81>65 women);hypertension;obesity (BMI >30kg/m2);smoking/ tobacco exposure     Objective:     Vitals: BP 136/64 (BP Location: Right Arm)   Pulse 70   Temp 98.5 F (36.9 C) (Oral)   Ht 5\' 1"  (1.549 m)   Wt 197 lb (89.4 kg)   BMI 37.22 kg/m   Body mass index is 37.22 kg/m.  Advanced Directives 05/24/2018 05/23/2017 01/31/2017  Does Patient Have a Medical Advance Directive? No No No  Would patient like information on creating a medical advance directive? No - Patient declined No - Patient declined No - Patient declined    Tobacco Social History   Tobacco Use  Smoking Status Former Smoker  . Packs/day: 0.50  . Years: 8.00  . Pack years: 4.00  . Types: Cigarettes  Smokeless Tobacco Never Used  Tobacco Comment   quit smoking in 1980s     Counseling given: Not Answered Comment: quit smoking in 1980s   Clinical Intake:  Pre-visit preparation completed: Yes  Pain : 0-10 Pain Score: 8  Pain Type: Chronic pain Pain Location: Shoulder(and back) Pain Orientation: Right Pain Descriptors / Indicators: Aching Pain Frequency: Constant     Nutritional Status: BMI > 30  Obese Nutritional Risks: None Diabetes: No  How often do you need to have someone help you when you read instructions, pamphlets, or other written materials from your doctor or pharmacy?: 1 - Never  Interpreter Needed?: No  Information entered by :: [facility], LPN  Past Medical History:  Diagnosis Date  . Anxiety   . Arthritis   . Cerebral artery occlusion with cerebral infarction (HCC)   . GERD (gastroesophageal reflux disease)   . Herniated disc, cervical   . History of chicken pox   . History of measles   . History of mumps   . Hyperlipidemia   . Hypertension   . OSA (obstructive sleep apnea)    . Osteopenia   . Thyroid disease    Past Surgical History:  Procedure Laterality Date  . ABDOMINAL HYSTERECTOMY     unilateral OOP  . AUGMENTATION MAMMAPLASTY  1975 &1992   removal of implants in 1992  . CAROTID DOPPLER ULTRASOUND  01/04/2013   No hemodynamically significant stenosis  . CHOLECYSTECTOMY    . DOPPLER ECHOCARDIOGRAPHY  04/04/2013   mild MVR and TR. mild LVH  . MRI BRAIN,BRAIN STEM  01/04/2013   Chronic Ischemic changes. Old lacunar infarct. Severe sinusitis  . MYOCARDIAL PERFUSION SCAN  04/05/2013   Normal  . pulmonary function test  07/21/2010   Normal; Done by Dr. Welton FlakesKhan  . SPINAL FUSION  1988   lower back  . TOTAL HIP ARTHROPLASTY Left 2006  . VULVAR LESION REMOVAL N/A 02/01/2017   Procedure: INCISION AND DRAINAGE VULVAR ABSCESS;  Surgeon: Feliberto GottronSchermerhorn, Ihor Austinhomas J, MD;  Location: [facility] ORS;  Service: Gynecology;  Laterality: N/A;   Family History  Problem Relation Age of Onset  . Cancer Mother        female cancer (?)  . Pancreatic cancer Father   . Colon polyps Father   . Breast cancer Maternal Aunt    Social History   Socioeconomic History  . Marital status: Single    Spouse name: Not on file  . Number of children: 5  . Years of education: Not on file  .  Highest education level: Some college, no degree  Occupational History  . Occupation: Disabled  Social Needs  . Financial resource strain: Somewhat hard  . Food insecurity:    Worry: Never true    Inability: Never true  . Transportation needs:    Medical: No    Non-medical: No  Tobacco Use  . Smoking status: Former Smoker    Packs/day: 0.50    Years: 8.00    Pack years: 4.00    Types: Cigarettes  . Smokeless tobacco: Never Used  . Tobacco comment: quit smoking in 1980s  Substance and Sexual Activity  . Alcohol use: No    Alcohol/week: 0.0 standard drinks  . Drug use: Yes    Types: Marijuana    Comment: uses marijuana  . Sexual activity: Not on file  Lifestyle  . Physical activity:     Days per week: 0 days    Minutes per session: 0 min  . Stress: Rather much  Relationships  . Social connections:    Talks on phone: Patient refused    Gets together: Patient refused    Attends religious service: Patient refused    Active member of club or organization: Patient refused    Attends meetings of clubs or organizations: Patient refused    Relationship status: Patient refused  Other Topics Concern  . Not on file  Social History Narrative  . Not on file    Outpatient Encounter Medications as of 05/24/2018  Medication Sig  . Armodafinil 250 MG tablet TAKE 1 TABLET BY MOUTH EVERY DAY  . aspirin 81 MG tablet Take 81 mg by mouth daily.   Marland Kitchen atenolol-chlorthalidone (TENORETIC) 50-25 MG tablet TAKE 1/2 TABLET BY MOUTH EVERY DAY  . Biotin 1 MG CAPS Take by mouth daily.   . diphenhydrAMINE (BENADRYL) 25 MG tablet Take by mouth.  . EPINEPHrine (EPIPEN 2-PAK) 0.3 mg/0.3 mL IJ SOAJ injection Inject into the muscle as needed.   Marland Kitchen EPINEPHRINE 0.3 mg/0.3 mL IJ SOAJ injection USE AS DIRECTED  . FLUoxetine (PROZAC) 20 MG capsule Take 3 capsules (60 mg total) by mouth daily.  . fluticasone (FLONASE) 50 MCG/ACT nasal spray SHAKE LIQUID AND USE 2 SPRAYS IN EACH NOSTRIL EVERY NIGHT AT BEDTIME  . HYDROcodone-acetaminophen (NORCO) 7.5-325 MG tablet Take 1 tablet by mouth every 6 (six) hours as needed for moderate pain.  Marland Kitchen ipratropium (ATROVENT) 0.06 % nasal spray INHALE 2 SPRAYS IN EACH NOSTRIL THREE TIMES DAILY  . Magnesium 500 MG CAPS Take by mouth 2 (two) times daily.   . montelukast (SINGULAIR) 10 MG tablet TAKE 1 TABLET BY MOUTH EVERY DAY  . Multiple Vitamins-Minerals (CENTRUM SILVER 50+MEN) TABS Take by mouth.  . naproxen (NAPROSYN) 500 MG tablet TAKE 1 TABLET BY MOUTH TWICE DAILY AS NEEDED  . nystatin (MYCOSTATIN) 100000 UNIT/ML suspension as needed.   Marland Kitchen omeprazole (PRILOSEC) 20 MG capsule TAKE 1 CAPSULE BY MOUTH DAILY  . PRESCRIPTION MEDICATION CPAP every night  . PROAIR HFA 108 (90  Base) MCG/ACT inhaler USE 2 PUFFS BY INHALATION EVERY 6 HOURS AS NEEDED  . SYNTHROID 150 MCG tablet TAKE 1 TABLET(150 MCG) BY MOUTH DAILY BEFORE BREAKFAST  . tiZANidine (ZANAFLEX) 4 MG tablet TAKE 1 TABLET BY MOUTH EVERY 8 HOURS AS NEEDED  . topiramate (TOPAMAX) 100 MG tablet TAKE 1 TABLET BY MOUTH TWICE DAILY  . triamcinolone cream (KENALOG) 0.5 % APPLY TOPICALLY UP TO THREE TIMES DAILY AS NEEDED  . Vitamin D, Ergocalciferol, (DRISDOL) 50000 UNITS CAPS capsule Take 50,000 Units  by mouth every 7 (seven) days.    No facility-administered encounter medications on file as of 05/24/2018.     Activities of Daily Living In your present state of health, do you have any difficulty performing the following activities: 05/24/2018  Hearing? N  Vision? Y  Comment Due to cataract on right eye. Wears eye glasses.   Difficulty concentrating or making decisions? N  Walking or climbing stairs? Y  Dressing or bathing? N  Doing errands, shopping? N  Preparing Food and eating ? N  Using the Toilet? N  In the past six months, have you accidently leaked urine? N  Do you have problems with loss of bowel control? N  Managing your Medications? N  Managing your Finances? N  Housekeeping or managing your Housekeeping? N  Some recent data might be hidden    Patient Care Team: Malva Limes, MD as PCP - General (Family Medicine) Rana Snare, MD as Consulting Physician (Internal Medicine)    Assessment:   This is a routine wellness examination for [place].  Exercise Activities and Dietary recommendations Current Exercise Habits: Structured exercise class, Type of exercise: stretching(PT), Time (Minutes): 60, Frequency (Times/Week): 2, Weekly Exercise (Minutes/Week): 120, Intensity: Mild, Exercise limited by: orthopedic condition(s)  Goals    . Cut out extra servings     Recommend to continue current diet regimen of cutting out sweets and junk food.        Fall Risk Fall Risk  05/24/2018  05/15/2018 05/23/2017 02/24/2016  Falls in the past year? 1 1 Yes No  Number falls in past yr: 1 0 1 -  Injury with Fall? 0 0 - -  Follow up Falls prevention discussed - Falls prevention discussed -   FALL RISK PREVENTION PERTAINING TO THE HOME:  Any stairs in or around the home WITH handrails? No  Home free of loose throw rugs in walkways, pet beds, electrical cords, etc? Yes  Adequate lighting in your home to reduce risk of falls? Yes   ASSISTIVE DEVICES UTILIZED TO PREVENT FALLS:  Life alert? No  Use of a cane, walker or w/c? Yes  Grab bars in the bathroom? No  Shower chair or bench in shower? Yes  Elevated toilet seat or a handicapped toilet? No    TIMED UP AND GO:  Was the test performed? No .    Depression Screen PHQ 2/9 Scores 05/24/2018 05/15/2018 05/23/2017 02/24/2016  PHQ - 2 Score 0 0 0 0  PHQ- 9 Score - - 4 -     Cognitive Function     6CIT Screen 05/23/2017  What Year? 0 points  What month? 0 points  What time? 0 points  Count back from 20 0 points  Months in reverse 0 points  Repeat phrase 4 points  Total Score 4    Immunization History  Administered Date(s) Administered  . Pneumococcal Polysaccharide-23 03/23/2012  . Tdap 03/23/2012  . Zoster 03/23/2012    Qualifies for Shingles Vaccine? Yes  Zostavax completed 03/23/12. Due for Shingrix. Education has been provided regarding the importance of this vaccine. Pt has been advised to call insurance company to determine out of pocket expense. Advised may also receive vaccine at local pharmacy or Health Dept. Verbalized acceptance and understanding.  Tdap: Up to date  Flu Vaccine: Due for Flu vaccine. Does the patient want to receive this vaccine today?  No . Education has been provided regarding the importance of this vaccine but still declined. Advised may receive this vaccine  at local pharmacy or Health Dept. Aware to provide a copy of the vaccination record if obtained from local pharmacy or Health Dept.  Verbalized acceptance and understanding.  Pneumococcal Vaccine: Due for Pneumococcal vaccine. Does the patient want to receive this vaccine today?  No . Education has been provided regarding the importance of this vaccine but still declined. Advised may receive this vaccine at local pharmacy or Health Dept. Aware to provide a copy of the vaccination record if obtained from local pharmacy or Health Dept. Verbalized acceptance and understanding.   Screening Tests Health Maintenance  Topic Date Due  . Hepatitis C Screening  10/18/1945  . PNA vac Low Risk Adult (2 of 2 - PCV13) 03/23/2013  . COLONOSCOPY  05/23/2016  . INFLUENZA VACCINE  09/18/2018 (Originally 01/18/2018)  . MAMMOGRAM  05/19/2019  . DEXA SCAN  05/23/2019  . TETANUS/TDAP  03/23/2022    Cancer Screenings:  Colorectal Screening: Completed 05/24/11. Repeat every 5 years; Referral to GI placed today. Pt aware the office will call re: appt.  Mammogram: Completed 05/18/17.   Bone Density: Completed 05/22/14. Results reflect OSTEOPENIA. Repeat every 5 years.   Lung Cancer Screening: (Low Dose CT Chest recommended if Age 55-80 years, 30 pack-year currently smoking OR have quit w/in 15years.) does not qualify.    Additional Screening:  Hepatitis C Screening: does qualify; however declined today.   Vision Screening: Recommended annual ophthalmology exams for early detection of glaucoma and other disorders of the eye.  Dental Screening: Recommended annual dental exams for proper oral hygiene  Community Resource Referral:  CRR required this visit?  No       Plan:  I have personally reviewed and addressed the Medicare Annual Wellness questionnaire and have noted the following in the patient's chart:  A. Medical and social history B. Use of alcohol, tobacco or illicit drugs  C. Current medications and supplements D. Functional ability and status E.  Nutritional status F.  Physical activity G. Advance directives H. List  of other physicians I.  Hospitalizations, surgeries, and ER visits in previous 12 months J.  Vitals K. Screenings such as hearing and vision if needed, cognitive and depression L. Referrals and appointments - none  In addition, I have reviewed and discussed with patient certain preventive protocols, quality metrics, and best practice recommendations. A written personalized care plan for preventive services as well as general preventive health recommendations were provided to patient.  See attached scanned questionnaire for additional information.   Signed,  Hyacinth Meeker, LPN Nurse Health Advisor   Nurse Recommendations: Pt declined the Hep C lab, tetanus vaccine and influenza vaccine today.

## 2018-05-24 NOTE — Telephone Encounter (Signed)
[facility] [place] faxed refill request for the following medications:  naproxen (NAPROSYN) 500 MG tablet  90 day supply  Please advise. Thanks TNP

## 2018-05-25 LAB — TSH: TSH: 0.039 u[IU]/mL — AB (ref 0.450–4.500)

## 2018-05-25 LAB — LIPID PANEL
Chol/HDL Ratio: 4.3 ratio (ref 0.0–4.4)
Cholesterol, Total: 203 mg/dL — ABNORMAL HIGH (ref 100–199)
HDL: 47 mg/dL (ref >39)
LDL Calculated: 118 mg/dL — ABNORMAL HIGH (ref 0–99)
Triglycerides: 189 mg/dL — ABNORMAL HIGH (ref 0–149)
VLDL Cholesterol Cal: 38 mg/dL (ref 5–40)

## 2018-05-25 LAB — COMPREHENSIVE METABOLIC PANEL
ALT: 13 IU/L (ref 0–32)
AST: 17 IU/L (ref 0–40)
Albumin/Globulin Ratio: 1.6 (ref 1.2–2.2)
Albumin: 4.2 g/dL (ref 3.5–4.8)
Alkaline Phosphatase: 85 IU/L (ref 39–117)
BUN/Creatinine Ratio: 17 (ref 12–28)
BUN: 17 mg/dL (ref 8–27)
Bilirubin Total: 0.2 mg/dL (ref 0.0–1.2)
CALCIUM: 9.6 mg/dL (ref 8.7–10.3)
CO2: 21 mmol/L (ref 20–29)
Chloride: 104 mmol/L (ref 96–106)
Creatinine, Ser: 1.01 mg/dL — ABNORMAL HIGH (ref 0.57–1.00)
GFR calc Af Amer: 64 mL/min/{1.73_m2} (ref >59)
GFR, EST NON AFRICAN AMERICAN: 56 mL/min/{1.73_m2} — AB (ref >59)
GLUCOSE: 89 mg/dL (ref 65–99)
Globulin, Total: 2.6 g/dL (ref 1.5–4.5)
Potassium: 4.4 mmol/L (ref 3.5–5.2)
SODIUM: 141 mmol/L (ref 134–144)
Total Protein: 6.8 g/dL (ref 6.0–8.5)

## 2018-05-25 LAB — VITAMIN D 25 HYDROXY (VIT D DEFICIENCY, FRACTURES): Vit D, 25-Hydroxy: 57.3 ng/mL (ref 30.0–100.0)

## 2018-05-28 ENCOUNTER — Other Ambulatory Visit: Payer: Self-pay

## 2018-05-28 MED ORDER — HYDROCODONE-ACETAMINOPHEN 7.5-325 MG PO TABS: 1.0000 | ORAL_TABLET | Freq: Four times a day (QID) | ORAL | 0 refills | Status: DC | PRN

## 2018-05-28 NOTE — Telephone Encounter (Signed)
Patient had called office today requesting refill on Hydrocodone. KW

## 2018-05-29 ENCOUNTER — Other Ambulatory Visit: Payer: Self-pay

## 2018-05-29 ENCOUNTER — Telehealth: Payer: Self-pay

## 2018-05-29 DIAGNOSIS — Z86010 Personal history of colonic polyps: Secondary | ICD-10-CM

## 2018-05-29 NOTE — Telephone Encounter (Signed)
During triage patient stated she has a history of colon polyps. Referral stated screening colonoscopy, but due to history of colon polyps referral dx will be entered as history of colon polyps.  Thanks [employer]

## 2018-05-30 ENCOUNTER — Encounter (INDEPENDENT_AMBULATORY_CARE_PROVIDER_SITE_OTHER): Payer: Self-pay

## 2018-05-30 ENCOUNTER — Ambulatory Visit
Admission: RE | Admit: 2018-05-30 | Discharge: 2018-05-30 | Disposition: A | Discharge status: Home / Self Care | Payer: Medicare Other | Source: Ambulatory Visit | Attending: Family Medicine | Admitting: Family Medicine

## 2018-05-30 DIAGNOSIS — Z1231 Encounter for screening mammogram for malignant neoplasm of breast: Secondary | ICD-10-CM | POA: Insufficient documentation

## 2018-05-31 DIAGNOSIS — M25511 Pain in right shoulder: Secondary | ICD-10-CM | POA: Diagnosis not present

## 2018-05-31 DIAGNOSIS — M25561 Pain in right knee: Secondary | ICD-10-CM | POA: Diagnosis not present

## 2018-06-05 ENCOUNTER — Other Ambulatory Visit: Payer: Self-pay

## 2018-06-05 DIAGNOSIS — Z86010 Personal history of colonic polyps: Secondary | ICD-10-CM

## 2018-06-06 DIAGNOSIS — M25511 Pain in right shoulder: Secondary | ICD-10-CM | POA: Diagnosis not present

## 2018-06-06 DIAGNOSIS — M25561 Pain in right knee: Secondary | ICD-10-CM | POA: Diagnosis not present

## 2018-06-22 DIAGNOSIS — M25561 Pain in right knee: Secondary | ICD-10-CM | POA: Diagnosis not present

## 2018-06-22 DIAGNOSIS — M25511 Pain in right shoulder: Secondary | ICD-10-CM | POA: Diagnosis not present

## 2018-06-28 ENCOUNTER — Encounter: Payer: Self-pay | Admitting: Anesthesiology

## 2018-06-28 NOTE — Discharge Instructions (Signed)
General Anesthesia, Adult, Care After  This sheet gives you information about how to care for yourself after your procedure. Your health care provider may also give you more specific instructions. If you have problems or questions, contact your health care provider.  What can I expect after the procedure?  After the procedure, the following side effects are common:  Pain or discomfort at the IV site.  Nausea.  Vomiting.  Sore throat.  Trouble concentrating.  Feeling cold or chills.  Weak or tired.  Sleepiness and fatigue.  Soreness and body aches. These side effects can affect parts of the body that were not involved in surgery.  Follow these instructions at home:    For at least 24 hours after the procedure:  Have a responsible adult stay with you. It is important to have someone help care for you until you are awake and alert.  Rest as needed.  Do not:  Participate in activities in which you could fall or become injured.  Drive.  Use heavy machinery.  Drink alcohol.  Take sleeping pills or medicines that cause drowsiness.  Make important decisions or sign legal documents.  Take care of children on your own.  Eating and drinking  Follow any instructions from your health care provider about eating or drinking restrictions.  When you feel hungry, start by eating small amounts of foods that are soft and easy to digest (bland), such as toast. Gradually return to your regular diet.  Drink enough fluid to keep your urine pale yellow.  If you vomit, rehydrate by drinking water, juice, or clear broth.  General instructions  If you have sleep apnea, surgery and certain medicines can increase your risk for breathing problems. Follow instructions from your health care provider about wearing your sleep device:  Anytime you are sleeping, including during daytime naps.  While taking prescription pain medicines, sleeping medicines, or medicines that make you drowsy.  Return to your normal activities as told by your health care  provider. Ask your health care provider what activities are safe for you.  Take over-the-counter and prescription medicines only as told by your health care provider.  If you smoke, do not smoke without supervision.  Keep all follow-up visits as told by your health care provider. This is important.  Contact a health care provider if:  You have nausea or vomiting that does not get better with medicine.  You cannot eat or drink without vomiting.  You have pain that does not get better with medicine.  You are unable to pass urine.  You develop a skin rash.  You have a fever.  You have redness around your IV site that gets worse.  Get help right away if:  You have difficulty breathing.  You have chest pain.  You have blood in your urine or stool, or you vomit blood.  Summary  After the procedure, it is common to have a sore throat or nausea. It is also common to feel tired.  Have a responsible adult stay with you for the first 24 hours after general anesthesia. It is important to have someone help care for you until you are awake and alert.  When you feel hungry, start by eating small amounts of foods that are soft and easy to digest (bland), such as toast. Gradually return to your regular diet.  Drink enough fluid to keep your urine pale yellow.  Return to your normal activities as told by your health care provider. Ask your health care   provider what activities are safe for you.  This information is not intended to replace advice given to you by your health care provider. Make sure you discuss any questions you have with your health care provider.  Document Released: 09/12/2000 Document Revised: 01/20/2017 Document Reviewed: 01/20/2017  Elsevier Interactive Patient Education  2019 Elsevier Inc[employer]

## 2018-06-29 ENCOUNTER — Ambulatory Visit: Admission: RE | Admit: 2018-06-29 | Payer: Medicare Other | Source: Home / Self Care | Admitting: Gastroenterology

## 2018-06-29 ENCOUNTER — Other Ambulatory Visit: Payer: Self-pay

## 2018-06-29 ENCOUNTER — Other Ambulatory Visit: Payer: Self-pay | Admitting: Family Medicine

## 2018-06-29 ENCOUNTER — Encounter: Admission: RE | Payer: Self-pay | Source: Home / Self Care

## 2018-06-29 SURGERY — COLONOSCOPY WITH PROPOFOL
Anesthesia: Choice

## 2018-06-29 MED ORDER — HYDROCODONE-ACETAMINOPHEN 7.5-325 MG PO TABS: 1.0000 | ORAL_TABLET | Freq: Four times a day (QID) | ORAL | 0 refills | Status: DC | PRN

## 2018-06-29 MED ORDER — ARMODAFINIL 250 MG PO TABS: 250.0000 mg | ORAL_TABLET | Freq: Every day | ORAL | 5 refills | Status: DC

## 2018-06-29 NOTE — Telephone Encounter (Signed)
Patient called requesting refills. Thanks!  

## 2018-07-03 ENCOUNTER — Other Ambulatory Visit: Payer: Self-pay | Admitting: Family Medicine

## 2018-07-03 MED ORDER — ARMODAFINIL 250 MG PO TABS: 250.0000 mg | ORAL_TABLET | Freq: Every day | ORAL | 5 refills | Status: DC

## 2018-07-03 MED ORDER — OMEPRAZOLE 20 MG PO CPDR: 20.0000 mg | DELAYED_RELEASE_CAPSULE | Freq: Every day | ORAL | 4 refills | Status: DC

## 2018-07-03 NOTE — Telephone Encounter (Signed)
Pt needing the following filled.  Pharmacy has both on hold.  Pt is out.   omeprazole (PRILOSEC) 20 MG capsule  Armodafinil 250 MG tablet   Please fill at:  [id number], [place] - [address] AT [facility] OF [address] [phone] (Phone) [phone] (Fax)    Please advise.  Thanks, [employer]

## 2018-07-13 DIAGNOSIS — M25511 Pain in right shoulder: Secondary | ICD-10-CM | POA: Diagnosis not present

## 2018-07-13 DIAGNOSIS — M25561 Pain in right knee: Secondary | ICD-10-CM | POA: Diagnosis not present

## 2018-07-17 DIAGNOSIS — M25511 Pain in right shoulder: Secondary | ICD-10-CM | POA: Diagnosis not present

## 2018-07-17 DIAGNOSIS — M25561 Pain in right knee: Secondary | ICD-10-CM | POA: Diagnosis not present

## 2018-07-20 ENCOUNTER — Telehealth: Payer: Self-pay

## 2018-07-20 DIAGNOSIS — E039 Hypothyroidism, unspecified: Secondary | ICD-10-CM

## 2018-07-20 NOTE — Telephone Encounter (Signed)
We checked her thyroid function in December and she was getting too much thyroid hormone. Can change dose to daily, #90, rf x 1 and follow up in about 2-3 months with new endocrinologist.

## 2018-07-20 NOTE — Telephone Encounter (Signed)
Patient called office with concerns of her thyroid level. Patient states that she normally sees endocrinologist and states that when she went to her office she found out that her doctor no longer works for practice. Patient states the earliest appt they could put her in with another doctor would be in April. Patient wants to know if Dr. Sherrie Mustache can refill her synthroid and put in order for her to have her thyroid level checked? Please review chart and advise. KW

## 2018-07-23 DIAGNOSIS — M25561 Pain in right knee: Secondary | ICD-10-CM | POA: Diagnosis not present

## 2018-07-23 DIAGNOSIS — M25511 Pain in right shoulder: Secondary | ICD-10-CM | POA: Diagnosis not present

## 2018-07-25 MED ORDER — LEVOTHYROXINE SODIUM 125 MCG PO TABS: 125.0000 ug | ORAL_TABLET | Freq: Every day | ORAL | 1 refills | Status: DC

## 2018-07-25 NOTE — Telephone Encounter (Signed)
Patient advised and agrees with treatment plan. New prescription for Levothyroxine dose sent into pharmacy.

## 2018-07-25 NOTE — Telephone Encounter (Signed)
Tried calling patient. Left message to call back. 

## 2018-07-26 ENCOUNTER — Other Ambulatory Visit: Payer: Self-pay | Admitting: Family Medicine

## 2018-07-27 ENCOUNTER — Other Ambulatory Visit: Payer: Self-pay | Admitting: Family Medicine

## 2018-07-27 MED ORDER — HYDROCODONE-ACETAMINOPHEN 7.5-325 MG PO TABS: 1.0000 | ORAL_TABLET | Freq: Four times a day (QID) | ORAL | 0 refills | Status: DC | PRN

## 2018-07-27 NOTE — Telephone Encounter (Signed)
Pt needing a refill on: ° °HYDROcodone-acetaminophen (NORCO) 7.5-325 MG tablet ° °Please fill at: ° °[id number] - [place], [place] - [address] AT [address] OF [address] [phone] (Phone) [phone] (Fax)  ° °Thanks, °[facility] °

## 2018-08-01 DIAGNOSIS — M25561 Pain in right knee: Secondary | ICD-10-CM | POA: Diagnosis not present

## 2018-08-01 DIAGNOSIS — M25511 Pain in right shoulder: Secondary | ICD-10-CM | POA: Diagnosis not present

## 2018-08-06 DIAGNOSIS — M25511 Pain in right shoulder: Secondary | ICD-10-CM | POA: Diagnosis not present

## 2018-08-06 DIAGNOSIS — M25561 Pain in right knee: Secondary | ICD-10-CM | POA: Diagnosis not present

## 2018-08-08 DIAGNOSIS — M25511 Pain in right shoulder: Secondary | ICD-10-CM | POA: Diagnosis not present

## 2018-08-08 DIAGNOSIS — M25561 Pain in right knee: Secondary | ICD-10-CM | POA: Diagnosis not present

## 2018-08-21 ENCOUNTER — Other Ambulatory Visit: Payer: Self-pay | Admitting: Family Medicine

## 2018-08-22 ENCOUNTER — Other Ambulatory Visit: Payer: Self-pay | Admitting: Family Medicine

## 2018-08-23 DIAGNOSIS — M25561 Pain in right knee: Secondary | ICD-10-CM | POA: Diagnosis not present

## 2018-08-23 DIAGNOSIS — M25511 Pain in right shoulder: Secondary | ICD-10-CM | POA: Diagnosis not present

## 2018-08-24 ENCOUNTER — Other Ambulatory Visit: Payer: Self-pay

## 2018-08-24 NOTE — Telephone Encounter (Signed)
Patient of Dr. Esmond Camper requesting refill on Hydrocodone-Acetaminophen and Atrovent nasal spray. Patient request these prescription be sent in today, I did notify patient that Dr. Sherrie Mustache is out of the office. KW

## 2018-08-27 MED ORDER — IPRATROPIUM BROMIDE 0.06 % NA SOLN: NASAL | 12 refills | Status: DC

## 2018-08-27 MED ORDER — HYDROCODONE-ACETAMINOPHEN 7.5-325 MG PO TABS: 1.0000 | ORAL_TABLET | Freq: Four times a day (QID) | ORAL | 0 refills | Status: DC | PRN

## 2018-08-27 NOTE — Telephone Encounter (Signed)
Pt called back today wanting to know if these prescriptions have been sent  She wanted to add Ipratropium bromide .06 42 mcg spray (nasal Spray)  [facility] [place]  Thanks  [name]

## 2018-08-27 NOTE — Telephone Encounter (Signed)
Sent order for medications to Dr Sherrie Mustache and called and advised pt that if any issues that we would call her back.  dbs

## 2018-08-28 ENCOUNTER — Ambulatory Visit: Payer: Medicare Other | Admitting: Family Medicine

## 2018-08-29 ENCOUNTER — Telehealth: Payer: Self-pay | Admitting: Family Medicine

## 2018-08-29 DIAGNOSIS — E7801 Familial hypercholesterolemia: Secondary | ICD-10-CM

## 2018-08-29 DIAGNOSIS — E039 Hypothyroidism, unspecified: Secondary | ICD-10-CM

## 2018-08-29 DIAGNOSIS — I2510 Atherosclerotic heart disease of native coronary artery without angina pectoris: Secondary | ICD-10-CM

## 2018-08-29 NOTE — Telephone Encounter (Signed)
-----   Message from Malva Limes, MD sent at 08/10/2018  4:43 PM EST ----- Regarding: FW: recheck lipids after being on rosuvastatin a few months. around feb 2020 HAS O.V. 08-28-2018 ----- Message ----- From: Malva Limes, MD Sent: 08/07/2018 To: Malva Limes, MD Subject: recheck lipids after being on rosuvastatin a#

## 2018-08-29 NOTE — Telephone Encounter (Signed)
Please check with patient and see if tolerating rosuvastatin prescribed in December. If so need to recheck fasting lipids and check thyroid functions since changing levothyroxine dose last month.  Please leave printed order at lab.

## 2018-09-26 ENCOUNTER — Other Ambulatory Visit: Payer: Self-pay

## 2018-09-26 MED ORDER — ALBUTEROL SULFATE HFA 108 (90 BASE) MCG/ACT IN AERS: INHALATION_SPRAY | RESPIRATORY_TRACT | 6 refills | Status: DC

## 2018-09-26 MED ORDER — HYDROCODONE-ACETAMINOPHEN 7.5-325 MG PO TABS: 1.0000 | ORAL_TABLET | Freq: Four times a day (QID) | ORAL | 0 refills | Status: DC | PRN

## 2018-10-01 DIAGNOSIS — E039 Hypothyroidism, unspecified: Secondary | ICD-10-CM | POA: Diagnosis not present

## 2018-10-24 ENCOUNTER — Other Ambulatory Visit: Payer: Self-pay

## 2018-10-25 MED ORDER — HYDROCODONE-ACETAMINOPHEN 7.5-325 MG PO TABS: 1.0000 | ORAL_TABLET | Freq: Four times a day (QID) | ORAL | 0 refills | Status: DC | PRN

## 2018-11-17 ENCOUNTER — Other Ambulatory Visit: Payer: Self-pay | Admitting: Family Medicine

## 2018-11-26 ENCOUNTER — Other Ambulatory Visit: Payer: Self-pay | Admitting: Family Medicine

## 2018-11-26 MED ORDER — HYDROCODONE-ACETAMINOPHEN 7.5-325 MG PO TABS: 1.0000 | ORAL_TABLET | Freq: Four times a day (QID) | ORAL | 0 refills | Status: DC | PRN

## 2018-11-26 NOTE — Telephone Encounter (Signed)
Pt needing a refill on: HYDROcodone-acetaminophen (NORCO) 7.5-325 MG tablet  Please fill at:  [id number], [place] [address] AT [place] [phone] (Phone) [phone] (Fax)    Thanks, [employer]

## 2018-12-18 ENCOUNTER — Other Ambulatory Visit: Payer: Self-pay | Admitting: Family Medicine

## 2018-12-24 ENCOUNTER — Other Ambulatory Visit: Payer: Self-pay

## 2018-12-24 MED ORDER — HYDROCODONE-ACETAMINOPHEN 7.5-325 MG PO TABS: 1.0000 | ORAL_TABLET | Freq: Four times a day (QID) | ORAL | 0 refills | Status: DC | PRN

## 2018-12-24 NOTE — Telephone Encounter (Signed)
Patient calling requesting refill on the following medication: HYDROcodone-acetaminophen ([place]) 7.5-325 MG tablet   Pharmacy: [place], [place] [address] AT [place]

## 2019-01-18 NOTE — Telephone Encounter (Signed)
LMTCB 01/18/2019   Thanks,   -Loralie Malta  

## 2019-01-21 ENCOUNTER — Other Ambulatory Visit: Payer: Self-pay | Admitting: Family Medicine

## 2019-01-21 DIAGNOSIS — E039 Hypothyroidism, unspecified: Secondary | ICD-10-CM

## 2019-01-22 NOTE — Telephone Encounter (Signed)
LMTCB 01/22/2019   Thanks,   -Yitzhak Awan  

## 2019-01-23 ENCOUNTER — Telehealth: Payer: Self-pay | Admitting: Family Medicine

## 2019-01-23 NOTE — Telephone Encounter (Signed)
Pt advised.  She states she is coming in on Monday for an appointment.    Thanks,   -Mickel Baas

## 2019-01-23 NOTE — Telephone Encounter (Signed)
Pt stated she was returning Laura's call. Please advise. Thanks TNP

## 2019-01-25 ENCOUNTER — Other Ambulatory Visit: Payer: Self-pay

## 2019-01-25 MED ORDER — HYDROCODONE-ACETAMINOPHEN 7.5-325 MG PO TABS: 1.0000 | ORAL_TABLET | Freq: Four times a day (QID) | ORAL | 0 refills | Status: DC | PRN

## 2019-01-25 NOTE — Telephone Encounter (Signed)
Patient called requesting a refill on Norco 7.5-325 MG. L.O.V. was 05/24/18 and upcoming visit 01/28/2019, please advise.

## 2019-01-28 ENCOUNTER — Ambulatory Visit: Payer: Medicare Other | Admitting: Family Medicine

## 2019-01-28 NOTE — Progress Notes (Deleted)
Patient: Morgan SlaughterCarolyn H Pepitone Female    DOB: 1946/06/13   73 y.o.   MRN: [id number] Visit Date: 01/28/2019  Today's Provider: Mila Merryonald Fisher, MD   No chief complaint on file.  Subjective:     HPI  Hypertension, follow-up:  BP Readings from Last 3 Encounters:  05/24/18 136/64  05/15/18 134/82  05/23/17 (!) 160/78    She was last seen for hypertension 8 months ago.  BP at that visit was 136/64. Management changes since that visit include none. She reports {excellent/good/fair/poor:19665} compliance with treatment. She {ACTION; IS/IS [id number]:[id number]} having side effects. *** She {is/is not:9024} exercising. She {is/is not:9024} adherent to low salt diet.   Outside blood pressures are ***. She is experiencing {Symptoms; cardiac:12860}.  Patient denies {Symptoms; cardiac:12860}.   Cardiovascular risk factors include {cv risk factors:510}.  Use of agents associated with hypertension: {bp agents assoc with hypertension:511::"none"}.     Weight trend: {trend:16658} Wt Readings from Last 3 Encounters:  05/24/18 197 lb (89.4 kg)  05/15/18 195 lb 6.4 oz (88.6 kg)  05/23/17 193 lb 6.4 oz (87.7 kg)    Current diet: {diet habits:16563}  ------------------------------------------------------------------------  Lipid/Cholesterol, Follow-up:   Last seen for this8 months ago.  Management changes since that visit include  Starting patient on Rousuvastatin. . Last Lipid Panel:    Component Value Date/Time   CHOL 203 (H) 05/24/2018 1157   TRIG 189 (H) 05/24/2018 1157   HDL 47 05/24/2018 1157   CHOLHDL 4.3 05/24/2018 1157   CHOLHDL 5.3 (H) 05/23/2017 1613   LDLCALC 118 (H) 05/24/2018 1157   LDLCALC 210 (H) 05/23/2017 1613    Risk factors for vascular disease include {risk factors atherosclerosis:10337}  She reports {excellent/good/fair/poor:19665} compliance with treatment. She {ACTION; IS/IS [id number]}OT:21021397} having side effects.  Current symptoms include {Symptoms;  diabetes:14075} and have been {Desc; course:15616}. Weight trend: {trend:16658} Prior visit with dietician: {yes/no:17258} Current diet: {diet habits:16563} Current exercise: {exercise types:16438}  Wt Readings from Last 3 Encounters:  05/24/18 197 lb (89.4 kg)  05/15/18 195 lb 6.4 oz (88.6 kg)  05/23/17 193 lb 6.4 oz (87.7 kg)    -------------------------------------------------------------------  Allergies  Allergen Reactions  . Repatha [Evolocumab] Rash    Swelling in throat and used Epipen  . Oysters  [Shellfish Allergy]   . Simvastatin     Muscle Aches     Current Outpatient Medications:  .  albuterol (PROAIR HFA) 108 (90 Base) MCG/ACT inhaler, USE 2 PUFFS BY INHALATION EVERY 6 HOURS AS NEEDED, Disp: 8.5 g, Rfl: 6 .  Armodafinil 250 MG tablet, TAKE 1 TABLET(250 MG) BY MOUTH DAILY, Disp: 30 tablet, Rfl: 5 .  aspirin 81 MG tablet, Take 81 mg by mouth daily. , Disp: , Rfl:  .  atenolol-chlorthalidone (TENORETIC) 50-25 MG tablet, Take 0.5 tablets by mouth daily., Disp: 30 tablet, Rfl: 5 .  Biotin 1 MG CAPS, Take by mouth daily. , Disp: , Rfl:  .  diphenhydrAMINE (BENADRYL) 25 MG tablet, Take by mouth., Disp: , Rfl:  .  EPINEPHrine (EPIPEN 2-PAK) 0.3 mg/0.3 mL IJ SOAJ injection, Inject into the muscle as needed. , Disp: , Rfl:  .  EPINEPHRINE 0.3 mg/0.3 mL IJ SOAJ injection, USE AS DIRECTED, Disp: 2 Device, Rfl: 2 .  FLUoxetine (PROZAC) 20 MG capsule, TAKE 3 CAPSULES(60 MG) BY MOUTH DAILY, Disp: 270 capsule, Rfl: 4 .  fluticasone (FLONASE) 50 MCG/ACT nasal spray, SHAKE LIQUID AND USE 2 SPRAYS IN EACH NOSTRIL EVERY NIGHT AT BEDTIME, Disp: 16 g, Rfl:  5 .  HYDROcodone-acetaminophen (NORCO) 7.5-325 MG tablet, Take 1 tablet by mouth every 6 (six) hours as needed for moderate pain., Disp: 120 tablet, Rfl: 0 .  ipratropium (ATROVENT) 0.06 % nasal spray, INHALE 2 SPRAYS IN EACH NOSTRIL THREE TIMES DAILY, Disp: 15 mL, Rfl: 12 .  levothyroxine (SYNTHROID) 125 MCG tablet, TAKE 1 TABLET(125  MCG) BY MOUTH DAILY, Disp: 90 tablet, Rfl: 4 .  Magnesium 500 MG CAPS, Take by mouth 2 (two) times daily. , Disp: , Rfl:  .  montelukast (SINGULAIR) 10 MG tablet, TAKE 1 TABLET BY MOUTH EVERY DAY, Disp: 90 tablet, Rfl: 5 .  Multiple Vitamins-Minerals (CENTRUM SILVER 50+MEN) TABS, Take by mouth., Disp: , Rfl:  .  naproxen (NAPROSYN) 500 MG tablet, TAKE 1 TABLET(500 MG) BY MOUTH TWICE DAILY AS NEEDED, Disp: 180 tablet, Rfl: 1 .  nystatin (MYCOSTATIN) 100000 UNIT/ML suspension, as needed. , Disp: , Rfl:  .  omeprazole (PRILOSEC) 20 MG capsule, Take 1 capsule (20 mg total) by mouth daily., Disp: 90 capsule, Rfl: 4 .  PRESCRIPTION MEDICATION, CPAP every night, Disp: , Rfl:  .  rosuvastatin (CRESTOR) 5 MG tablet, Take 1 tablet (5 mg total) by mouth daily., Disp: 30 tablet, Rfl: 3 .  tiZANidine (ZANAFLEX) 4 MG tablet, TAKE 1 TABLET BY MOUTH EVERY 8 HOURS AS NEEDED, Disp: 270 tablet, Rfl: 4 .  topiramate (TOPAMAX) 100 MG tablet, TAKE 1 TABLET BY MOUTH TWICE DAILY, Disp: 180 tablet, Rfl: 4 .  triamcinolone cream (KENALOG) 0.5 %, APPLY TOPICALLY UP TO THREE TIMES DAILY AS NEEDED, Disp: 30 g, Rfl: 0 .  Vitamin D, Ergocalciferol, (DRISDOL) 50000 UNITS CAPS capsule, Take 50,000 Units by mouth every 7 (seven) days. , Disp: , Rfl:   Review of Systems  Social History   Tobacco Use  . Smoking status: Former Smoker    Packs/day: 0.50    Years: 8.00    Pack years: 4.00    Types: Cigarettes  . Smokeless tobacco: Never Used  . Tobacco comment: quit smoking in 1980s  Substance Use Topics  . Alcohol use: No    Alcohol/week: 0.0 standard drinks      Objective:   There were no vitals taken for this visit. There were no vitals filed for this visit.   Physical Exam   No results found for any visits on 01/28/19.     Assessment & Plan        Lelon Huh, MD  [facility] Clint Bolder as a scribe for Lelon Huh, MD.,have documented all  relevant documentation on the behalf of Lelon Huh, MD,as directed by  Lelon Huh, MD while in the presence of Lelon Huh, MD.

## 2019-02-22 ENCOUNTER — Other Ambulatory Visit: Payer: Self-pay | Admitting: *Deleted

## 2019-02-22 MED ORDER — HYDROCODONE-ACETAMINOPHEN 7.5-325 MG PO TABS: 1.0000 | ORAL_TABLET | Freq: Four times a day (QID) | ORAL | 0 refills | Status: DC | PRN

## 2019-03-22 ENCOUNTER — Other Ambulatory Visit: Payer: Self-pay | Admitting: Family Medicine

## 2019-03-22 NOTE — Telephone Encounter (Signed)
Pt needing a refill on:  HYDROcodone-acetaminophen (NORCO) 7.5-325 MG tablet  Please fill at:  [id number], [place] [address] AT [place] [phone] (Phone) [phone] (Fax)   Thanks, [employer]

## 2019-03-24 MED ORDER — HYDROCODONE-ACETAMINOPHEN 7.5-325 MG PO TABS: 1.0000 | ORAL_TABLET | Freq: Four times a day (QID) | ORAL | 0 refills | Status: DC | PRN

## 2019-04-19 ENCOUNTER — Other Ambulatory Visit: Payer: Self-pay | Admitting: Family Medicine

## 2019-04-19 MED ORDER — ALBUTEROL SULFATE HFA 108 (90 BASE) MCG/ACT IN AERS: INHALATION_SPRAY | RESPIRATORY_TRACT | 6 refills | Status: DC

## 2019-04-19 NOTE — Telephone Encounter (Signed)
Last OV 05/24/2018- Not within the last 6 months for me to approve refill. Please review.

## 2019-04-19 NOTE — Telephone Encounter (Signed)
[facility] faxed refill request for the following medications:  albuterol (PROAIR HFA) 108 (90 Base) MCG/ACT inhaler     Please advise.

## 2019-04-20 ENCOUNTER — Other Ambulatory Visit: Payer: Self-pay | Admitting: Family Medicine

## 2019-04-23 ENCOUNTER — Other Ambulatory Visit: Payer: Self-pay | Admitting: Family Medicine

## 2019-04-23 DIAGNOSIS — E039 Hypothyroidism, unspecified: Secondary | ICD-10-CM | POA: Diagnosis not present

## 2019-04-23 DIAGNOSIS — E559 Vitamin D deficiency, unspecified: Secondary | ICD-10-CM

## 2019-04-23 DIAGNOSIS — E7801 Familial hypercholesterolemia: Secondary | ICD-10-CM

## 2019-04-23 MED ORDER — HYDROCODONE-ACETAMINOPHEN 7.5-325 MG PO TABS: 1.0000 | ORAL_TABLET | Freq: Four times a day (QID) | ORAL | 0 refills | Status: DC | PRN

## 2019-04-23 NOTE — Telephone Encounter (Signed)
1. Pt contacted office for refill request on the following medications:  HYDROcodone-acetaminophen ([place]) 7.5-325 MG tablet [facility] [place] Last Rx: 03/24/2019 LOV: 05/24/2018  2. Pt stated that labs were order August 2020 but she didn't have them done. Pt is requesting the labs be reordered and to please let her know when the lab slip is ready.  Please advise. Thanks TNP

## 2019-04-23 NOTE — Addendum Note (Signed)
Addended by: Birdie Sons on: 04/23/2019 01:21 PM   Modules accepted: Orders

## 2019-04-23 NOTE — Addendum Note (Signed)
Addended by: Jules Schick on: 04/23/2019 01:24 PM   Modules accepted: Orders

## 2019-04-23 NOTE — Telephone Encounter (Signed)
Lab slip printed at the front desk in [address]. Left patient a message advising her that lab slip is at the front desk and she can come fasting to have labs done.

## 2019-04-23 NOTE — Telephone Encounter (Signed)
Please advise regarding labs. I do not see where any labs were ordered in August.

## 2019-04-23 NOTE — Telephone Encounter (Signed)
Please advise patient it is time to check cholesterol and thyroid, needs to be fasting. Please print order and leave at lab.

## 2019-04-24 ENCOUNTER — Telehealth: Payer: Self-pay

## 2019-04-24 DIAGNOSIS — E7801 Familial hypercholesterolemia: Secondary | ICD-10-CM

## 2019-04-24 LAB — COMPREHENSIVE METABOLIC PANEL
ALT: 15 IU/L (ref 0–32)
AST: 18 IU/L (ref 0–40)
Albumin/Globulin Ratio: 1.6 (ref 1.2–2.2)
Albumin: 4.4 g/dL (ref 3.7–4.7)
Alkaline Phosphatase: 98 IU/L (ref 39–117)
BUN/Creatinine Ratio: 16 (ref 12–28)
BUN: 19 mg/dL (ref 8–27)
Bilirubin Total: 0.2 mg/dL (ref 0.0–1.2)
CO2: 19 mmol/L — ABNORMAL LOW (ref 20–29)
Calcium: 10.2 mg/dL (ref 8.7–10.3)
Chloride: 107 mmol/L — ABNORMAL HIGH (ref 96–106)
Creatinine, Ser: 1.16 mg/dL — ABNORMAL HIGH (ref 0.57–1.00)
GFR calc Af Amer: 54 mL/min/{1.73_m2} — ABNORMAL LOW (ref >59)
GFR calc non Af Amer: 47 mL/min/{1.73_m2} — ABNORMAL LOW (ref >59)
Globulin, Total: 2.7 g/dL (ref 1.5–4.5)
Glucose: 98 mg/dL (ref 65–99)
Potassium: 4.2 mmol/L (ref 3.5–5.2)
Sodium: 139 mmol/L (ref 134–144)
Total Protein: 7.1 g/dL (ref 6.0–8.5)

## 2019-04-24 LAB — LIPID PANEL
Chol/HDL Ratio: 5.9 ratio — ABNORMAL HIGH (ref 0.0–4.4)
Cholesterol, Total: 282 mg/dL — ABNORMAL HIGH (ref 100–199)
HDL: 48 mg/dL (ref >39)
LDL Chol Calc (NIH): 191 mg/dL — ABNORMAL HIGH (ref 0–99)
Triglycerides: 225 mg/dL — ABNORMAL HIGH (ref 0–149)
VLDL Cholesterol Cal: 43 mg/dL — ABNORMAL HIGH (ref 5–40)

## 2019-04-24 LAB — TSH: TSH: 2.71 u[IU]/mL (ref 0.450–4.500)

## 2019-04-24 LAB — VITAMIN D 25 HYDROXY (VIT D DEFICIENCY, FRACTURES): Vit D, 25-Hydroxy: 50.7 ng/mL (ref 30.0–100.0)

## 2019-04-24 MED ORDER — EZETIMIBE 10 MG PO TABS: 10.0000 mg | ORAL_TABLET | Freq: Every day | ORAL | 3 refills | Status: DC

## 2019-04-24 NOTE — Telephone Encounter (Signed)
-----   Message from Birdie Sons, MD sent at 04/24/2019  8:18 AM EST ----- Cholesterol is very high at 282. High risk for strokes and ministrokes.  Needs to start ezetimibe 10mg  once a day, #30, rf x 3 and schedule follow up in 2 months This is not a statin and does not have any of the side effects that statins have.

## 2019-04-24 NOTE — Telephone Encounter (Signed)
Patient was advised and agrees on starting the medication. Follow-up appointment was scheduled for 06/24/2019 @ 11:00 AM.

## 2019-05-19 ENCOUNTER — Other Ambulatory Visit: Payer: Self-pay | Admitting: Family Medicine

## 2019-05-19 DIAGNOSIS — I10 Essential (primary) hypertension: Secondary | ICD-10-CM

## 2019-05-20 ENCOUNTER — Other Ambulatory Visit: Payer: Self-pay | Admitting: Family Medicine

## 2019-05-20 MED ORDER — TRIAMCINOLONE ACETONIDE 0.5 % EX CREA: TOPICAL_CREAM | Freq: Every day | CUTANEOUS | 1 refills | Status: DC | PRN

## 2019-05-20 MED ORDER — HYDROCODONE-ACETAMINOPHEN 7.5-325 MG PO TABS: 1.0000 | ORAL_TABLET | Freq: Four times a day (QID) | ORAL | 0 refills | Status: DC | PRN

## 2019-06-02 IMAGING — MG DIGITAL SCREENING BILATERAL MAMMOGRAM WITH IMPLANTS, CAD AND TOM
8 of 16 series · 8 of 32 positions shown · non-contrast
Comparison: Previous exam(s).

CLINICAL DATA: Screening.

EXAM:
DIGITAL SCREENING BILATERAL MAMMOGRAM WITH IMPLANTS, CAD AND TOMO
The patient has retroglandular implants. Standard and implant
displaced views were performed.

[L CC (1 of 2)]
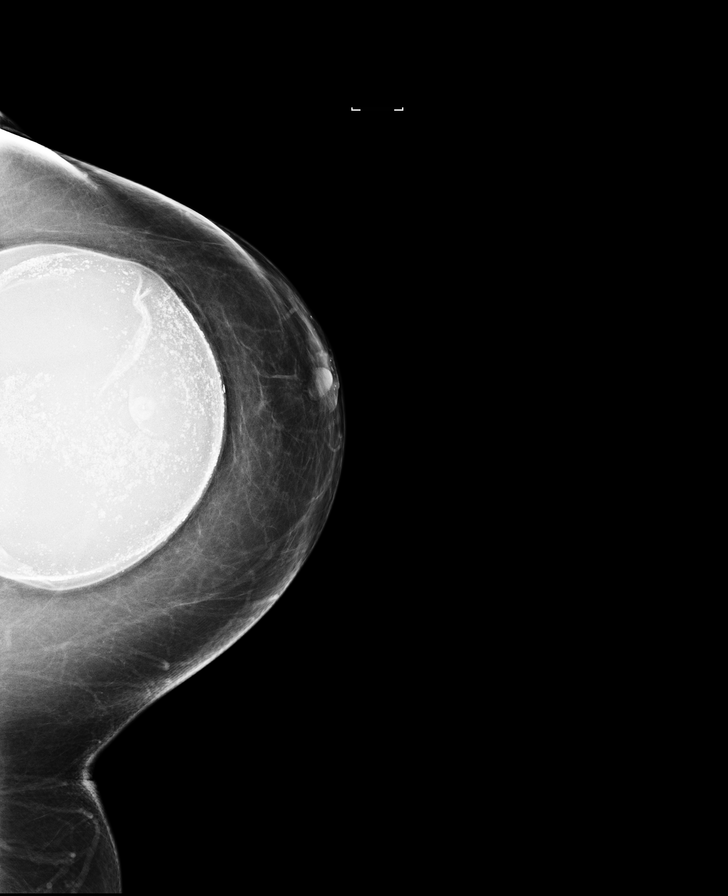

[L MLO (1 of 2)]
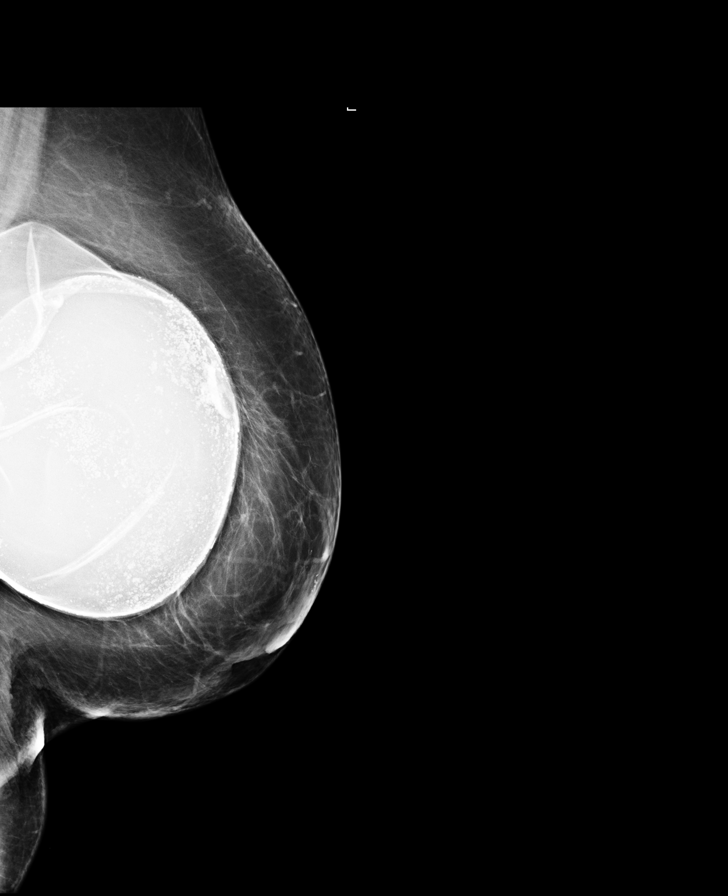

[R MLO (1 of 2)]
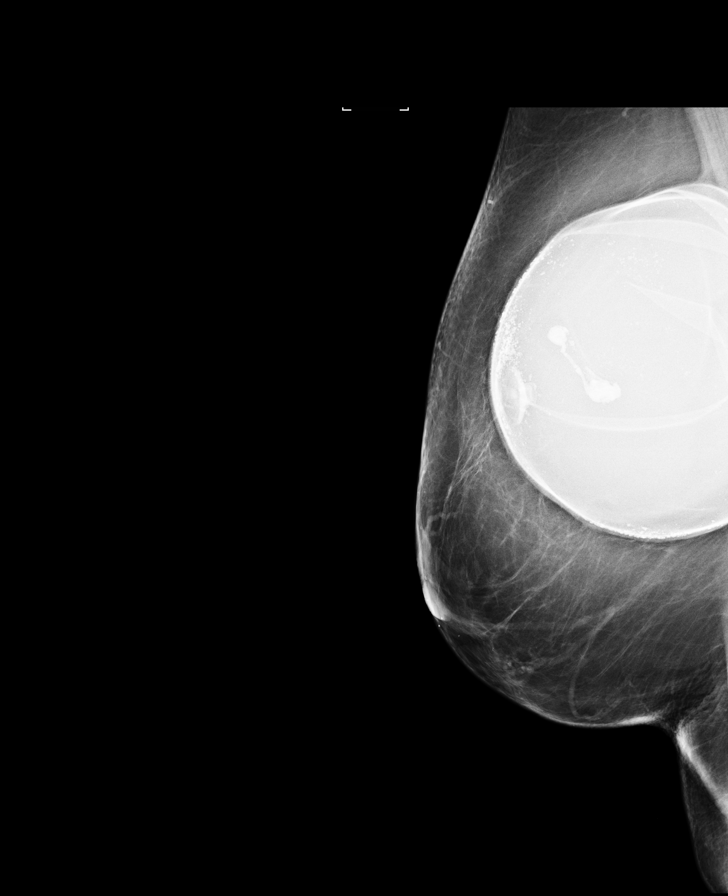

[R CC (1 of 2)]
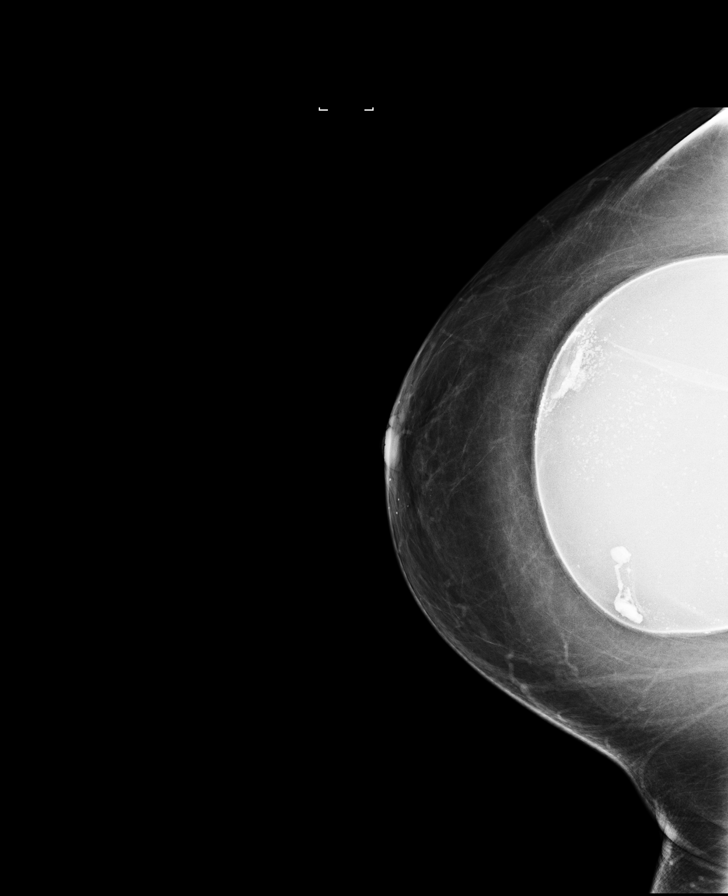

[R CC (2 of 2)]
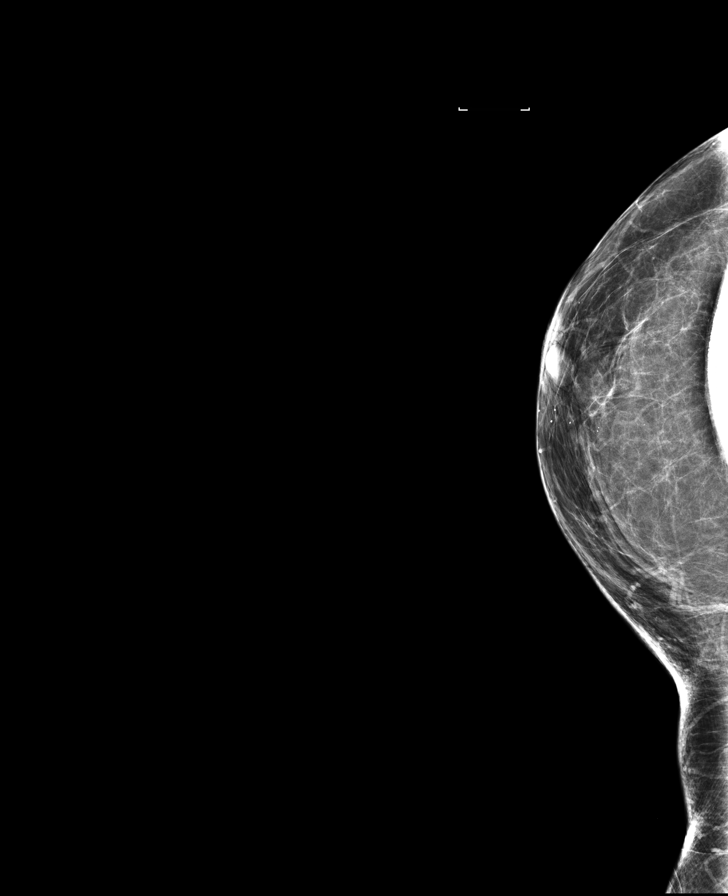

[L MLO (2 of 2)]
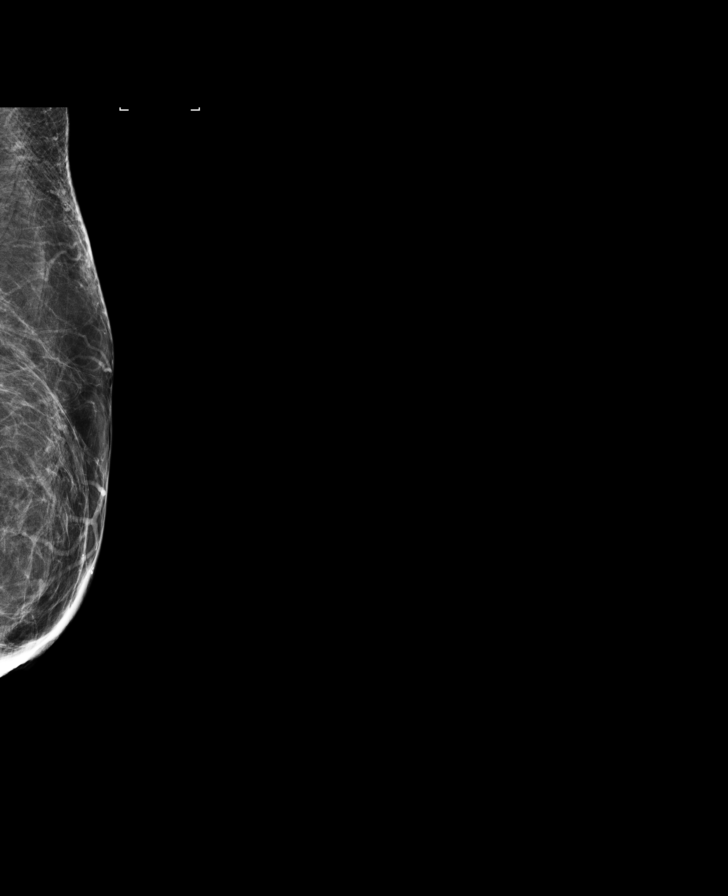

[L CC (2 of 2)]
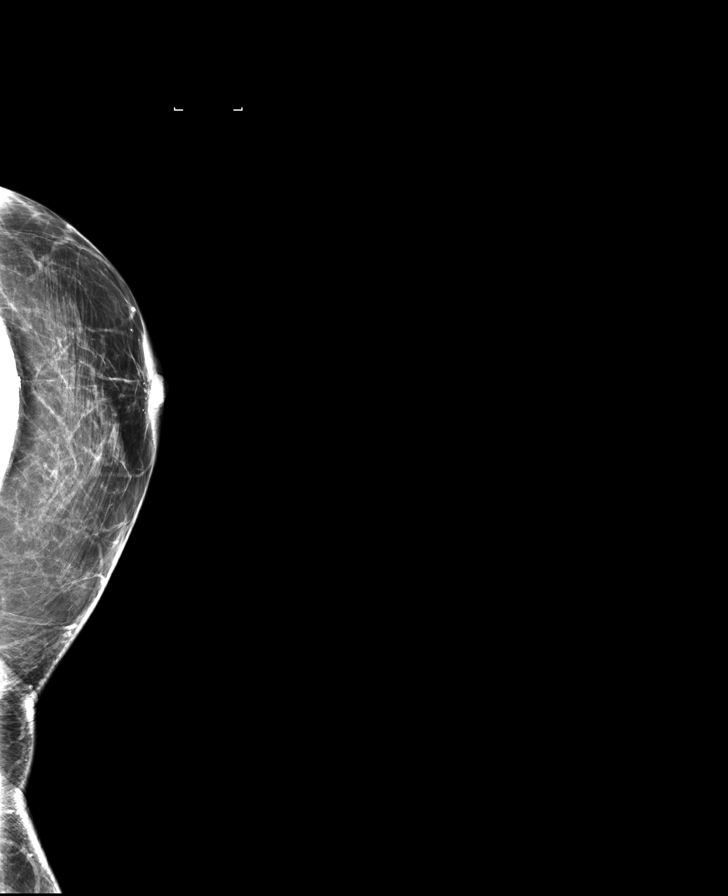

[R MLO (2 of 2)]
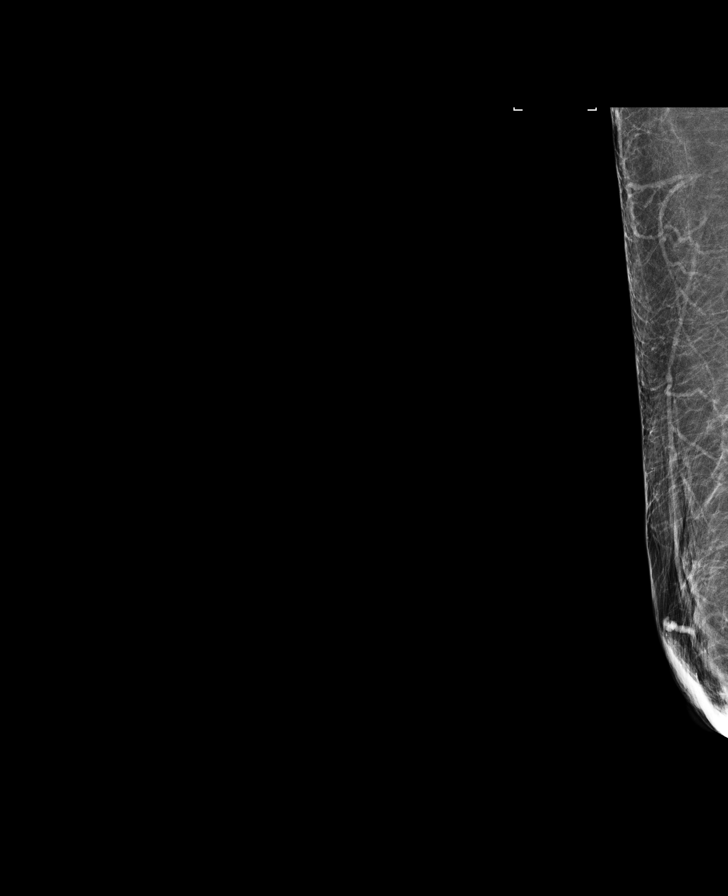

[8 of 32 positions shown; findings below may reference images not displayed]

ACR Breast Density Category b: There are scattered areas of
fibroglandular density.
FINDINGS: There are no findings suspicious for malignancy. Images were
processed with CAD.
IMPRESSION: No mammographic evidence of malignancy. A result letter of this
screening mammogram will be mailed directly to the patient.

RECOMMENDATION:
Screening mammogram in one year. (Code:[id number]-[id number])

BI-RADS CATEGORY  1:  Negative.

## 2019-06-18 ENCOUNTER — Other Ambulatory Visit: Payer: Self-pay | Admitting: Family Medicine

## 2019-06-18 NOTE — Telephone Encounter (Signed)
Patient has appointment on 06/24/19

## 2019-06-18 NOTE — Telephone Encounter (Signed)
Requested medication (s) are due for refill today: yes  Requested medication (s) are on the active medication list: yes  Last refill: 05/19/2019  Future visit scheduled: yes  Notes to clinic:  This refill cannot be delegated    Requested Prescriptions  Pending Prescriptions Disp Refills   Armodafinil 250 MG tablet [Pharmacy Med Name: ARMODAFINIL 250MG  TABLETS] 30 tablet     Sig: TAKE 1 TABLET(250 MG) BY MOUTH DAILY      Not Delegated - Psychiatry:  Stimulants/ADHD Failed - 06/18/2019  7:44 AM      Failed - This refill cannot be delegated      Failed - Urine Drug Screen completed in last 360 days.      Failed - Valid encounter within last 3 months    Recent Outpatient Visits           1 year ago Annual physical exam   [facility] Birdie Sons, MD   1 year ago Burning with urination   [facility], [place]   2 years ago Annual physical exam   [facility] Birdie Sons, MD   2 years ago Transient neurologic deficit   [facility] Birdie Sons, MD   3 years ago Transient neurologic deficit   [facility] Birdie Sons, MD       Future Appointments             In 6 days Fisher, Kirstie Peri, MD [facility], [place]

## 2019-06-20 ENCOUNTER — Encounter: Payer: Self-pay | Admitting: Family Medicine

## 2019-06-24 ENCOUNTER — Encounter: Payer: Self-pay | Admitting: Family Medicine

## 2019-06-24 ENCOUNTER — Ambulatory Visit (INDEPENDENT_AMBULATORY_CARE_PROVIDER_SITE_OTHER): Payer: Medicare Other | Admitting: Family Medicine

## 2019-06-24 ENCOUNTER — Other Ambulatory Visit: Payer: Self-pay

## 2019-06-24 VITALS — BP 138/72 | HR 67 | Temp 97.1°F | Resp 16 | Wt 199.0 lb

## 2019-06-24 DIAGNOSIS — E7801 Familial hypercholesterolemia: Secondary | ICD-10-CM | POA: Diagnosis not present

## 2019-06-24 DIAGNOSIS — M797 Fibromyalgia: Secondary | ICD-10-CM

## 2019-06-24 DIAGNOSIS — M791 Myalgia, unspecified site: Secondary | ICD-10-CM

## 2019-06-24 DIAGNOSIS — M5136 Other intervertebral disc degeneration, lumbar region: Secondary | ICD-10-CM

## 2019-06-24 DIAGNOSIS — M170 Bilateral primary osteoarthritis of knee: Secondary | ICD-10-CM | POA: Insufficient documentation

## 2019-06-24 DIAGNOSIS — I2510 Atherosclerotic heart disease of native coronary artery without angina pectoris: Secondary | ICD-10-CM

## 2019-06-24 MED ORDER — HYDROCODONE-ACETAMINOPHEN 7.5-325 MG PO TABS: 1.0000 | ORAL_TABLET | Freq: Four times a day (QID) | ORAL | 0 refills | Status: DC | PRN

## 2019-06-24 MED ORDER — COLESTIPOL HCL 1 G PO TABS: 1.0000 g | ORAL_TABLET | Freq: Two times a day (BID) | ORAL | 5 refills | Status: AC

## 2019-06-24 NOTE — Progress Notes (Signed)
Patient: Morgan Lynch Female    DOB: 21-Jul-1945   74 y.o.   MRN: [id number] Visit Date: 06/24/2019  Today's Provider: Mila Merry, MD   Chief Complaint  Patient presents with  . Hyperlipidemia  . Hypertension  . Pain   Subjective:     HPI  Lipid/Cholesterol, Follow-up:   Last seen for this 2 months ago.  Management changes since that visit include started Ezetimibe 10mg  daily. . Last Lipid Panel:    Component Value Date/Time   CHOL 282 (H) 04/23/2019 1444   TRIG 225 (H) 04/23/2019 1444   HDL 48 04/23/2019 1444   CHOLHDL 5.9 (H) 04/23/2019 1444   CHOLHDL 5.3 (H) 05/23/2017 1613   LDLCALC 191 (H) 04/23/2019 1444   LDLCALC 210 (H) 05/23/2017 1613    Risk factors for vascular disease include hypercholesterolemia and hypertension  She reports poor compliance with treatment. Patient tried taking medication for 1 month, but couldn't tolerate it due to side effects.  She is having side effects. Patient states the medication caused diarrhea, abdominal pain, headaches and bodyaches  Current symptoms include none and have been stable. Weight trend: stable Prior visit with dietician: no Current diet: in general, an "unhealthy" diet Current exercise: none  Wt Readings from Last 3 Encounters:  06/24/19 199 lb (90.3 kg)  05/24/18 197 lb (89.4 kg)  05/15/18 195 lb 6.4 oz (88.6 kg)    -------------------------------------------------------------------  Follow up for Chronic pain:  The patient was last seen for this 1 years ago. Changes made at last visit include no changes.  She reports good compliance with treatment. She feels that condition is Unchanged. She is not having side effects.  Hydrocodone/apap is helpful, but still has daily pain in her lower back, both wrists, Left > Right, and both knees, Right > Left. She is interested in trying back, wrist and knee braces which she has ordered from [facility].    ------------------------------------------------------------------------------------  Hypertension, follow-up:  BP Readings from Last 3 Encounters:  06/24/19 138/72  05/24/18 136/64  05/15/18 134/82    She was last seen for hypertension 1 years ago.  BP at that visit was 136/64. Management since that visit includes no changes. She reports good compliance with treatment. She is not having side effects.  She is not exercising. She is not adherent to low salt diet.   Outside blood pressures are checked occasionally. She is experiencing chest pain.  Patient denies chest pressure/discomfort, dyspnea, orthopnea, palpitations, paroxysmal nocturnal dyspnea, syncope and tachypnea.   Cardiovascular risk factors include dyslipidemia and hypertension.  Use of agents associated with hypertension: NSAIDS and thyroid hormones.     Weight trend: stable Wt Readings from Last 3 Encounters:  06/24/19 199 lb (90.3 kg)  05/24/18 197 lb (89.4 kg)  05/15/18 195 lb 6.4 oz (88.6 kg)    Current diet: in general, an "unhealthy" diet  ------------------------------------------------------------------------  Allergies  Allergen Reactions  . Repatha [Evolocumab] Rash    Swelling in throat and used Epipen  . Oysters  [Shellfish Allergy]   . Simvastatin     Muscle Aches     Current Outpatient Medications:  .  albuterol (PROAIR HFA) 108 (90 Base) MCG/ACT inhaler, USE 2 PUFFS BY INHALATION EVERY 6 HOURS AS NEEDED, Disp: 8.5 g, Rfl: 6 .  Armodafinil 250 MG tablet, TAKE 1 TABLET(250 MG) BY MOUTH DAILY, Disp: 30 tablet, Rfl: 1 .  aspirin 81 MG tablet, Take 81 mg by mouth daily. , Disp: , Rfl:  .  atenolol-chlorthalidone (TENORETIC) 50-25 MG tablet, TAKE 1/2 TABLET BY MOUTH DAILY, Disp: 30 tablet, Rfl: 5 .  Biotin 1 MG CAPS, Take by mouth daily. , Disp: , Rfl:  .  diphenhydrAMINE (BENADRYL) 25 MG tablet, Take by mouth., Disp: , Rfl:  .  EPINEPHRINE 0.3 mg/0.3 mL IJ SOAJ injection, USE AS DIRECTED,  Disp: 2 each, Rfl: 5 .  FLUoxetine (PROZAC) 20 MG capsule, TAKE 3 CAPSULES(60 MG) BY MOUTH DAILY, Disp: 270 capsule, Rfl: 4 .  fluticasone (FLONASE) 50 MCG/ACT nasal spray, SHAKE LIQUID AND USE 2 SPRAYS IN EACH NOSTRIL EVERY NIGHT AT BEDTIME, Disp: 16 g, Rfl: 5 .  HYDROcodone-acetaminophen (NORCO) 7.5-325 MG tablet, Take 1 tablet by mouth every 6 (six) hours as needed for moderate pain., Disp: 120 tablet, Rfl: 0 .  ipratropium (ATROVENT) 0.06 % nasal spray, INHALE 2 SPRAYS IN EACH NOSTRIL THREE TIMES DAILY, Disp: 15 mL, Rfl: 12 .  levothyroxine (SYNTHROID) 125 MCG tablet, TAKE 1 TABLET(125 MCG) BY MOUTH DAILY, Disp: 90 tablet, Rfl: 4 .  Magnesium 500 MG CAPS, Take by mouth 2 (two) times daily. , Disp: , Rfl:  .  Multiple Vitamins-Minerals (CENTRUM SILVER 50+MEN) TABS, Take by mouth., Disp: , Rfl:  .  naproxen (NAPROSYN) 500 MG tablet, TAKE 1 TABLET(500 MG) BY MOUTH TWICE DAILY AS NEEDED, Disp: 180 tablet, Rfl: 1 .  omeprazole (PRILOSEC) 20 MG capsule, Take 1 capsule (20 mg total) by mouth daily., Disp: 90 capsule, Rfl: 4 .  PRESCRIPTION MEDICATION, CPAP every night, Disp: , Rfl:  .  tiZANidine (ZANAFLEX) 4 MG tablet, TAKE 1 TABLET BY MOUTH EVERY 8 HOURS AS NEEDED, Disp: 270 tablet, Rfl: 4 .  topiramate (TOPAMAX) 100 MG tablet, TAKE 1 TABLET BY MOUTH TWICE DAILY, Disp: 180 tablet, Rfl: 4 .  triamcinolone cream (KENALOG) 0.5 %, Apply topically daily as needed., Disp: 30 g, Rfl: 1 .  Vitamin D, Ergocalciferol, (DRISDOL) 50000 UNITS CAPS capsule, Take 50,000 Units by mouth every 7 (seven) days. , Disp: , Rfl:  .  ezetimibe (ZETIA) 10 MG tablet, Take 1 tablet (10 mg total) by mouth daily. (Patient not taking: Reported on 06/24/2019), Disp: 90 tablet, Rfl: 3 .  montelukast (SINGULAIR) 10 MG tablet, TAKE 1 TABLET BY MOUTH EVERY DAY (Patient not taking: Reported on 06/24/2019), Disp: 90 tablet, Rfl: 5  Review of Systems  Constitutional: Positive for fatigue. Negative for appetite change, chills and fever.    Respiratory: Negative for chest tightness and shortness of breath.   Cardiovascular: Positive for chest pain. Negative for palpitations.  Gastrointestinal: Negative for abdominal pain, nausea and vomiting.  Musculoskeletal: Positive for arthralgias and neck pain.  Neurological: Negative for dizziness and weakness.    Social History   Tobacco Use  . Smoking status: Former Smoker    Packs/day: 0.50    Years: 8.00    Pack years: 4.00    Types: Cigarettes  . Smokeless tobacco: Never Used  . Tobacco comment: quit smoking in 1980s  Substance Use Topics  . Alcohol use: No    Alcohol/week: 0.0 standard drinks      Objective:   BP 138/72 (BP Location: Left Arm, Patient Position: Sitting, Cuff Size: Large)   Pulse 67   Temp (!) 97.1 F (36.2 C) (Temporal)   Resp 16   Wt 199 lb (90.3 kg)   SpO2 99% Comment: room air  BMI 37.60 kg/m  Vitals:   06/24/19 1103  BP: 138/72  Pulse: 67  Resp: 16  Temp: (!) 97.1 F (36.2  C)  TempSrc: Temporal  SpO2: 99%  Weight: 199 lb (90.3 kg)  Body mass index is 37.6 kg/m.   Physical Exam   General Appearance:    Obese female in no acute distress  Eyes:    PERRL, conjunctiva/corneas clear, EOM's intact       Lungs:     Clear to auscultation bilaterally, respirations unlabored  Heart:    Normal heart rate. Normal rhythm. No murmurs, rubs, or gallops.   MS:   All extremities are intact.   Neurologic:   Awake, alert, oriented x 3. No apparent focal neurological           defect.   Ext:   Pain with bilateral wrist flexion and extension. Slightly swelling, no erythema of knees. Able to stand and bear weight with pain.    No results found for any visits on 06/24/19.     Assessment & Plan    1. Coronary artery disease involving native coronary artery of native heart without angina pectoris Asymptomatic. Compliant with medication.  Continue daily ECASA. Intolerant to all statins tried, ezetimibe and Repatha.   2. Myalgia   3. Familial  hypercholesteremia Intolerant to multiple cholesterol medications as above. Will try- colestipol (COLESTID) 1 g tablet; Take 1 tablet (1 g total) by mouth 2 (two) times daily.  Dispense: 60 tablet; Refill: 5  4. DDD (degenerative disc disease), lumbar Significant pain relief.  - HYDROcodone-acetaminophen (NORCO) 7.5-325 MG tablet; Take 1 tablet by mouth every 6 (six) hours as needed for moderate pain.  Dispense: 120 tablet; Refill: 0  She would likely benefit from a mobility back brace ordered today.   5. Fibromyalgia Diffuse symptoms including her wrists, she would like benefit from wrist brace.   6. Primary osteoarthritis of both knees Previously followed by orthopedic and reports she is not ready for surgery. She would likely benefit from knee brace.   Future Appointments  Date Time Provider [place]  10/04/2019  1:40 PM Tyr Franca, Kirstie Peri, MD [id number]        Lelon Huh, MD  [facility]

## 2019-06-26 ENCOUNTER — Other Ambulatory Visit: Payer: Self-pay | Admitting: Family Medicine

## 2019-06-26 DIAGNOSIS — M13832 Other specified arthritis, left wrist: Secondary | ICD-10-CM | POA: Diagnosis not present

## 2019-06-26 DIAGNOSIS — M1711 Unilateral primary osteoarthritis, right knee: Secondary | ICD-10-CM | POA: Diagnosis not present

## 2019-06-26 DIAGNOSIS — M545 Low back pain: Secondary | ICD-10-CM | POA: Diagnosis not present

## 2019-06-26 MED ORDER — ARMODAFINIL 250 MG PO TABS: ORAL_TABLET | ORAL | 1 refills | Status: DC

## 2019-07-02 ENCOUNTER — Other Ambulatory Visit: Payer: Self-pay | Admitting: Family Medicine

## 2019-07-16 ENCOUNTER — Other Ambulatory Visit: Payer: Self-pay | Admitting: Family Medicine

## 2019-07-16 NOTE — Progress Notes (Signed)
Subjective:   Salley SlaughterCarolyn H Eckles is a 74 y.o. female who presents for Medicare Annual (Subsequent) preventive examination.    This visit is being conducted through telemedicine due to the COVID-19 pandemic. This patient has given me verbal consent via doximity to conduct this visit, patient states they are participating from their home address. Some vital signs may be absent or patient reported.    Patient identification: identified by name, DOB, and current address  Review of Systems:  N/A  Cardiac Risk Factors include: advanced age (>1155men, 33>65 women);hypertension;dyslipidemia;obesity (BMI >30kg/m2);smoking/ tobacco exposure     Objective:     Vitals: There were no vitals taken for this visit.  There is no height or weight on file to calculate BMI. Unable to obtain vitals due to visit being conducted via telephonically.   Advanced Directives 07/17/2019 05/24/2018 05/23/2017 01/31/2017  Does Patient Have a Medical Advance Directive? No No No No  Would patient like information on creating a medical advance directive? No - Patient declined No - Patient declined No - Patient declined No - Patient declined    Tobacco Social History   Tobacco Use  Smoking Status Former Smoker  . Packs/day: 0.50  . Years: 8.00  . Pack years: 4.00  . Types: Cigarettes  Smokeless Tobacco Never Used  Tobacco Comment   quit smoking in 1980s     Counseling given: Not Answered Comment: quit smoking in 1980s   Clinical Intake:  Pre-visit preparation completed: Yes  Pain : 0-10 Pain Score: 7  Pain Type: Chronic pain Pain Location: Back(shoulder and knee (all right side). Has DDD.) Pain Orientation: Right Pain Descriptors / Indicators: Aching Pain Frequency: Constant     Nutritional Risks: Nausea/ vomitting/ diarrhea(Has diarrhea intermittenly due to IBS.) Diabetes: No  How often do you need to have someone help you when you read instructions, pamphlets, or other written materials from your  doctor or pharmacy?: 1 - Never  Interpreter Needed?: No  Information entered by :: [facility], LPN  Past Medical History:  Diagnosis Date  . Anxiety   . Arthritis   . Cerebral artery occlusion with cerebral infarction (HCC)   . GERD (gastroesophageal reflux disease)   . Herniated disc, cervical   . History of chicken pox   . History of measles   . History of mumps   . Hyperlipidemia   . Hypertension   . OSA (obstructive sleep apnea)   . Osteopenia   . Thyroid disease    Past Surgical History:  Procedure Laterality Date  . ABDOMINAL HYSTERECTOMY     unilateral OOP  . AUGMENTATION MAMMAPLASTY  1975 &1992   replacedl of implants in 1992  . CAROTID DOPPLER ULTRASOUND  01/04/2013   No hemodynamically significant stenosis  . CHOLECYSTECTOMY    . DOPPLER ECHOCARDIOGRAPHY  04/04/2013   mild MVR and TR. mild LVH  . MRI BRAIN,BRAIN STEM  01/04/2013   Chronic Ischemic changes. Old lacunar infarct. Severe sinusitis  . MYOCARDIAL PERFUSION SCAN  04/05/2013   Normal  . pulmonary function test  07/21/2010   Normal; Done by Dr. Welton FlakesKhan  . SPINAL FUSION  1988   lower back  . TOTAL HIP ARTHROPLASTY Left 2006  . VULVAR LESION REMOVAL N/A 02/01/2017   Procedure: INCISION AND DRAINAGE VULVAR ABSCESS;  Surgeon: Feliberto GottronSchermerhorn, Ihor Austinhomas J, MD;  Location: [facility] ORS;  Service: Gynecology;  Laterality: N/A;   Family History  Problem Relation Age of Onset  . Cancer Mother        female  cancer (?)  . Pancreatic cancer Father   . Colon polyps Father   . Breast cancer Maternal Aunt   . Multiple sclerosis Daughter    Social History   Socioeconomic History  . Marital status: Divorced    Spouse name: Not on file  . Number of children: 5  . Years of education: Not on file  . Highest education level: Some college, no degree  Occupational History  . Occupation: Disabled  Tobacco Use  . Smoking status: Former Smoker    Packs/day: 0.50    Years: 8.00    Pack years: 4.00    Types: Cigarettes  .  Smokeless tobacco: Never Used  . Tobacco comment: quit smoking in 1980s  Substance and Sexual Activity  . Alcohol use: No    Alcohol/week: 0.0 standard drinks  . Drug use: Yes    Types: Marijuana    Comment: uses marijuana  . Sexual activity: Not on file  Other Topics Concern  . Not on file  Social History Narrative   1 miscarriage, married 3 times and now divorced   Social Determinants of Health   Financial Resource Strain: Low Risk   . Difficulty of Paying Living Expenses: Not hard at all  Food Insecurity: No Food Insecurity  . Worried About Charity fundraiser in the Last Year: Never true  . Ran Out of Food in the Last Year: Never true  Transportation Needs: No Transportation Needs  . Lack of Transportation (Medical): No  . Lack of Transportation (Non-Medical): No  Physical Activity: Inactive  . Days of Exercise per Week: 0 days  . Minutes of Exercise per Session: 0 min  Stress: No Stress Concern Present  . Feeling of Stress : Not at all  Social Connections: Moderately Isolated  . Frequency of Communication with Friends and Family: Three times a week  . Frequency of Social Gatherings with Friends and Family: Never  . Attends Religious Services: Never  . Active Member of Clubs or Organizations: No  . Attends Archivist Meetings: Never  . Marital Status: Divorced    Outpatient Encounter Medications as of 07/17/2019  Medication Sig  . albuterol (PROAIR HFA) 108 (90 Base) MCG/ACT inhaler USE 2 PUFFS BY INHALATION EVERY 6 HOURS AS NEEDED  . Armodafinil 250 MG tablet TAKE 1 TABLET(250 MG) BY MOUTH DAILY  . aspirin 81 MG tablet Take 81 mg by mouth daily.   Marland Kitchen atenolol-chlorthalidone (TENORETIC) 50-25 MG tablet TAKE 1/2 TABLET BY MOUTH DAILY  . Biotin 1 MG CAPS Take by mouth daily.   . Cholecalciferol 125 MCG (5000 UT) capsule Take by mouth as directed. Takes 10 tablets once a week.  . diphenhydrAMINE (BENADRYL) 25 MG tablet Take by mouth every 6 (six) hours as  needed.   Marland Kitchen EPINEPHRINE 0.3 mg/0.3 mL IJ SOAJ injection USE AS DIRECTED  . Flaxseed, Linseed, (FLAX SEED OIL) 1000 MG CAPS Take 1,000 mg by mouth daily.  Marland Kitchen FLUoxetine (PROZAC) 20 MG capsule TAKE 3 CAPSULES(60 MG) BY MOUTH DAILY  . fluticasone (FLONASE) 50 MCG/ACT nasal spray SHAKE LIQUID AND USE 2 SPRAYS IN EACH NOSTRIL EVERY NIGHT AT BEDTIME  . HYDROcodone-acetaminophen (NORCO) 7.5-325 MG tablet Take 1 tablet by mouth every 6 (six) hours as needed for moderate pain.  Marland Kitchen ipratropium (ATROVENT) 0.06 % nasal spray INHALE 2 SPRAYS IN EACH NOSTRIL THREE TIMES DAILY  . levothyroxine (SYNTHROID) 125 MCG tablet TAKE 1 TABLET(125 MCG) BY MOUTH DAILY  . Magnesium 500 MG CAPS Take by mouth  2 (two) times daily.   . Multiple Vitamins-Minerals (CENTRUM SILVER 50+MEN) TABS Take by mouth daily.   . naproxen (NAPROSYN) 500 MG tablet TAKE 1 TABLET(500 MG) BY MOUTH TWICE DAILY AS NEEDED  . omeprazole (PRILOSEC) 20 MG capsule Take 1 capsule (20 mg total) by mouth daily.  Marland Kitchen PRESCRIPTION MEDICATION CPAP every night  . pyridoxine (B-6) 100 MG tablet Take by mouth daily. Unsure dose - takes occasionally  . tiZANidine (ZANAFLEX) 4 MG tablet TAKE 1 TABLET BY MOUTH EVERY 8 HOURS AS NEEDED  . topiramate (TOPAMAX) 100 MG tablet TAKE 1 TABLET BY MOUTH TWICE DAILY  . triamcinolone cream (KENALOG) 0.5 % APPLY EXTERNALLY TO THE AFFECTED AREA DAILY AS NEEDED  . colestipol (COLESTID) 1 g tablet Take 1 tablet (1 g total) by mouth 2 (two) times daily. (Patient not taking: Reported on 07/17/2019)  . montelukast (SINGULAIR) 10 MG tablet TAKE 1 TABLET BY MOUTH EVERY DAY (Patient not taking: Reported on 06/24/2019)  . Vitamin D, Ergocalciferol, (DRISDOL) 50000 UNITS CAPS capsule Take 50,000 Units by mouth every 7 (seven) days.    No facility-administered encounter medications on file as of 07/17/2019.    Activities of Daily Living In your present state of health, do you have any difficulty performing the following activities: 07/17/2019  06/24/2019  Hearing? N N  Vision? N Y  Difficulty concentrating or making decisions? N N  Walking or climbing stairs? Y Y  Comment Due to right knee pains. -  Dressing or bathing? N N  Doing errands, shopping? N N  Preparing Food and eating ? N -  Using the Toilet? N -  In the past six months, have you accidently leaked urine? Y -  Comment Occasionally with urges. -  Do you have problems with loss of bowel control? N -  Managing your Medications? N -  Managing your Finances? N -  Housekeeping or managing your Housekeeping? N -  Some recent data might be hidden    Patient Care Team: Malva Limes, MD as PCP - General (Family Medicine) Sherlon Handing, MD as Consulting Physician (Internal Medicine)    Assessment:   This is a routine wellness examination for [place].  Exercise Activities and Dietary recommendations Current Exercise Habits: The patient does not participate in regular exercise at present, Exercise limited by: orthopedic condition(s)  Goals    . Cut out extra servings     Recommend to continue current diet regimen of cutting out sweets and junk food.     . Prevent falls     Recommend to remove any items from the home that may cause slips or trips.       Fall Risk: Fall Risk  07/17/2019 06/24/2019 06/24/2019 05/24/2018 05/15/2018  Falls in the past year? 1 0 0 1 1  Number falls in past yr: 0 0 0 1 0  Injury with Fall? 0 0 0 0 0  Follow up Falls prevention discussed Falls evaluation completed Falls evaluation completed Falls prevention discussed -    FALL RISK PREVENTION PERTAINING TO THE HOME:  Any stairs in or around the home? Yes  If so, are there any without handrails? No   Home free of loose throw rugs in walkways, pet beds, electrical cords, etc? Yes  Adequate lighting in your home to reduce risk of falls? Yes   ASSISTIVE DEVICES UTILIZED TO PREVENT FALLS:  Life alert? No  Use of a cane, walker or w/c? Yes  Grab bars in the bathroom? No  Shower chair or bench in shower? Yes  Elevated toilet seat or a handicapped toilet? No    TIMED UP AND GO:  Was the test performed? No .    Depression Screen PHQ 2/9 Scores 07/17/2019 07/17/2019 05/24/2018 05/15/2018  PHQ - 2 Score 0 0 0 0  PHQ- 9 Score - - - -     Cognitive Function     6CIT Screen 07/17/2019 05/23/2017  What Year? 4 points 0 points  What month? 0 points 0 points  What time? 0 points 0 points  Count back from 20 0 points 0 points  Months in reverse 0 points 0 points  Repeat phrase 0 points 4 points  Total Score 4 4    Immunization History  Administered Date(s) Administered  . Pneumococcal Conjugate-13 05/24/2018  . Pneumococcal Polysaccharide-23 03/23/2012  . Tdap 03/23/2012  . Zoster 03/23/2012    Qualifies for Shingles Vaccine? Yes  Zostavax completed 03/23/12. Due for Shingrix. Pt has been advised to call insurance company to determine out of pocket expense. Advised may also receive vaccine at local pharmacy or Health Dept. Verbalized acceptance and understanding.  Tdap: Up to date  Flu Vaccine: Due for Flu vaccine. Does the patient want to receive this vaccine today?  No . Advised may receive this vaccine at local pharmacy or Health Dept. Aware to provide a copy of the vaccination record if obtained from local pharmacy or Health Dept. Verbalized acceptance and understanding.  Pneumococcal Vaccine: Completed series  Screening Tests Health Maintenance  Topic Date Due  . Hepatitis C Screening  [birth year]  . COLONOSCOPY  05/23/2016  . DEXA SCAN  05/23/2019  . INFLUENZA VACCINE  09/18/2019 (Originally 01/19/2019)  . MAMMOGRAM  05/30/2020  . TETANUS/TDAP  03/23/2022  . PNA vac Low Risk Adult  Completed    Cancer Screenings:  Colorectal Screening: Completed 05/24/11. Repeat every 5 years. Referral to GI placed today. Pt aware the office will call re: appt.  Mammogram: Completed 05/30/18. Repeat every 1-2 years as advised.   Bone Density: Completed  05/22/14. Results reflect OSTEOPENIA. Repeat every 5 years. Ordered today. Pt provided with contact info and advised to call to schedule appt. Pt aware the office will call re: appt.  Lung Cancer Screening: (Low Dose CT Chest recommended if Age 37-80 years, 30 pack-year currently smoking OR have quit w/in 15years.) does not qualify.   Additional Screening:  Hepatitis C Screening: does qualify and would like to have this added to her next blood work orders. Note on apt made.   Vision Screening: Recommended annual ophthalmology exams for early detection of glaucoma and other disorders of the eye.  Dental Screening: Recommended annual dental exams for proper oral hygiene  Community Resource Referral:  CRR required this visit?  No       Plan:  I have personally reviewed and addressed the Medicare Annual Wellness questionnaire and have noted the following in the patient's chart:  A. Medical and social history B. Use of alcohol, tobacco or illicit drugs  C. Current medications and supplements D. Functional ability and status E.  Nutritional status F.  Physical activity G. Advance directives H. List of other physicians I.  Hospitalizations, surgeries, and ER visits in previous 12 months J.  Vitals K. Screenings such as hearing and vision if needed, cognitive and depression L. Referrals and appointments   In addition, I have reviewed and discussed with patient certain preventive protocols, quality metrics, and best practice recommendations. A written personalized care plan for  preventive services as well as general preventive health recommendations were provided to patient. Nurse Health Advisor  Signed,    Jaloni Sorber [place], [place]  [id number] Nurse Health Advisor   Nurse Notes: Pt to have the Hep C lab order added to next blood work orders. Colonoscopy and DEXA referrals sent today. Pt declined receiving a future flu shot.

## 2019-07-17 ENCOUNTER — Ambulatory Visit (INDEPENDENT_AMBULATORY_CARE_PROVIDER_SITE_OTHER): Payer: Medicare Other

## 2019-07-17 ENCOUNTER — Other Ambulatory Visit: Payer: Self-pay

## 2019-07-17 DIAGNOSIS — E2839 Other primary ovarian failure: Secondary | ICD-10-CM

## 2019-07-17 DIAGNOSIS — Z1211 Encounter for screening for malignant neoplasm of colon: Secondary | ICD-10-CM | POA: Diagnosis not present

## 2019-07-17 DIAGNOSIS — Z0000 Encounter for general adult medical examination without abnormal findings: Secondary | ICD-10-CM | POA: Diagnosis not present

## 2019-07-17 MED ORDER — MONTELUKAST SODIUM 10 MG PO TABS: 10.0000 mg | ORAL_TABLET | Freq: Every day | ORAL | 5 refills | Status: AC

## 2019-07-17 NOTE — Telephone Encounter (Signed)
Spoke with pt today to complete her AWV telephonically. Pt requested a refill on Montelukast 10 mg (1 tablet daily) to be be sent to the [facility] in [place]. Pt states she never received the previous prescription. Advised her the previous rx went to [facility] order pharmacy. Pt states that she is not using that pharmacy. Correct pharmacy has been selected. Please advise, thank you.

## 2019-07-17 NOTE — Patient Instructions (Signed)
Morgan Lynch , Thank you for taking time to come for your Medicare Wellness Visit. I appreciate your ongoing commitment to your health goals. Please review the following plan we discussed and let me know if I can assist you in the future.   Screening recommendations/referrals: Colonoscopy: Currently overdue. Referral to GI placed today. Pt aware the office will call re: appt. Mammogram: Up to date, due 05/2020 Bone Density: Currently overdue. Ordered today. Pt aware the office will call re: appt. Recommended yearly ophthalmology/optometry visit for glaucoma screening and checkup Recommended yearly dental visit for hygiene and checkup  Vaccinations: Influenza vaccine: Pt declines today.  Pneumococcal vaccine: Completed series Tdap vaccine: Up to date, due 03/2022 Shingles vaccine: Pt declines today.     Advanced directives: Advance directive discussed with you today. Even though you declined this today please call our office should you change your mind and we can give you the proper paperwork for you to fill out.  Conditions/risks identified: Fall risk prevention discussed today.  Next appointment: 10/04/19 @ 1:40 PM with Dr Sherrie Mustache.    Preventive Care 39 Years and Older, Female Preventive care refers to lifestyle choices and visits with your health care provider that can promote health and wellness. What does preventive care include?  A yearly physical exam. This is also called an annual well check.  Dental exams once or twice a year.  Routine eye exams. Ask your health care provider how often you should have your eyes checked.  Personal lifestyle choices, including:  Daily care of your teeth and gums.  Regular physical activity.  Eating a healthy diet.  Avoiding tobacco and drug use.  Limiting alcohol use.  Practicing safe sex.  Taking low-dose aspirin every day.  Taking vitamin and mineral supplements as recommended by your health care provider. What happens during an  annual well check? The services and screenings done by your health care provider during your annual well check will depend on your age, overall health, lifestyle risk factors, and family history of disease. Counseling  Your health care provider may ask you questions about your:  Alcohol use.  Tobacco use.  Drug use.  Emotional well-being.  Home and relationship well-being.  Sexual activity.  Eating habits.  History of falls.  Memory and ability to understand (cognition).  Work and work Astronomer.  Reproductive health. Screening  You may have the following tests or measurements:  Height, weight, and BMI.  Blood pressure.  Lipid and cholesterol levels. These may be checked every 5 years, or more frequently if you are over 46 years old.  Skin check.  Lung cancer screening. You may have this screening every year starting at age 5 if you have a 30-pack-year history of smoking and currently smoke or have quit within the past 15 years.  Fecal occult blood test (FOBT) of the stool. You may have this test every year starting at age 48.  Flexible sigmoidoscopy or colonoscopy. You may have a sigmoidoscopy every 5 years or a colonoscopy every 10 years starting at age 84.  Hepatitis C blood test.  Hepatitis B blood test.  Sexually transmitted disease (STD) testing.  Diabetes screening. This is done by checking your blood sugar (glucose) after you have not eaten for a while (fasting). You may have this done every 1-3 years.  Bone density scan. This is done to screen for osteoporosis. You may have this done starting at age 14.  Mammogram. This may be done every 1-2 years. Talk to your health care  provider about how often you should have regular mammograms. Talk with your health care provider about your test results, treatment options, and if necessary, the need for more tests. Vaccines  Your health care provider may recommend certain vaccines, such as:  Influenza  vaccine. This is recommended every year.  Tetanus, diphtheria, and acellular pertussis (Tdap, Td) vaccine. You may need a Td booster every 10 years.  Zoster vaccine. You may need this after age 67.  Pneumococcal 13-valent conjugate (PCV13) vaccine. One dose is recommended after age 65.  Pneumococcal polysaccharide (PPSV23) vaccine. One dose is recommended after age 32. Talk to your health care provider about which screenings and vaccines you need and how often you need them. This information is not intended to replace advice given to you by your health care provider. Make sure you discuss any questions you have with your health care provider. Document Released: 07/03/2015 Document Revised: 02/24/2016 Document Reviewed: 04/07/2015 Elsevier Interactive Patient Education  2017 [place] Prevention in the Home Falls can cause injuries. They can happen to people of all ages. There are many things you can do to make your home safe and to help prevent falls. What can I do on the outside of my home?  Regularly fix the edges of walkways and driveways and fix any cracks.  Remove anything that might make you trip as you walk through a door, such as a raised step or threshold.  Trim any bushes or trees on the path to your home.  Use bright outdoor lighting.  Clear any walking paths of anything that might make someone trip, such as rocks or tools.  Regularly check to see if handrails are loose or broken. Make sure that both sides of any steps have handrails.  Any raised decks and porches should have guardrails on the edges.  Have any leaves, snow, or ice cleared regularly.  Use sand or salt on walking paths during winter.  Clean up any spills in your garage right away. This includes oil or grease spills. What can I do in the bathroom?  Use night lights.  Install grab bars by the toilet and in the tub and shower. Do not use towel bars as grab bars.  Use non-skid mats or decals  in the tub or shower.  If you need to sit down in the shower, use a plastic, non-slip stool.  Keep the floor dry. Clean up any water that spills on the floor as soon as it happens.  Remove soap buildup in the tub or shower regularly.  Attach bath mats securely with double-sided non-slip rug tape.  Do not have throw rugs and other things on the floor that can make you trip. What can I do in the bedroom?  Use night lights.  Make sure that you have a light by your bed that is easy to reach.  Do not use any sheets or blankets that are too big for your bed. They should not hang down onto the floor.  Have a firm chair that has side arms. You can use this for support while you get dressed.  Do not have throw rugs and other things on the floor that can make you trip. What can I do in the kitchen?  Clean up any spills right away.  Avoid walking on wet floors.  Keep items that you use a lot in easy-to-reach places.  If you need to reach something above you, use a strong step stool that has a grab bar.  Keep electrical cords out of the way.  Do not use floor polish or wax that makes floors slippery. If you must use wax, use non-skid floor wax.  Do not have throw rugs and other things on the floor that can make you trip. What can I do with my stairs?  Do not leave any items on the stairs.  Make sure that there are handrails on both sides of the stairs and use them. Fix handrails that are broken or loose. Make sure that handrails are as long as the stairways.  Check any carpeting to make sure that it is firmly attached to the stairs. Fix any carpet that is loose or worn.  Avoid having throw rugs at the top or bottom of the stairs. If you do have throw rugs, attach them to the floor with carpet tape.  Make sure that you have a light switch at the top of the stairs and the bottom of the stairs. If you do not have them, ask someone to add them for you. What else can I do to help  prevent falls?  Wear shoes that:  Do not have high heels.  Have rubber bottoms.  Are comfortable and fit you well.  Are closed at the toe. Do not wear sandals.  If you use a stepladder:  Make sure that it is fully opened. Do not climb a closed stepladder.  Make sure that both sides of the stepladder are locked into place.  Ask someone to hold it for you, if possible.  Clearly mark and make sure that you can see:  Any grab bars or handrails.  First and last steps.  Where the edge of each step is.  Use tools that help you move around (mobility aids) if they are needed. These include:  Canes.  Walkers.  Scooters.  Crutches.  Turn on the lights when you go into a dark area. Replace any light bulbs as soon as they burn out.  Set up your furniture so you have a clear path. Avoid moving your furniture around.  If any of your floors are uneven, fix them.  If there are any pets around you, be aware of where they are.  Review your medicines with your doctor. Some medicines can make you feel dizzy. This can increase your chance of falling. Ask your doctor what other things that you can do to help prevent falls. This information is not intended to replace advice given to you by your health care provider. Make sure you discuss any questions you have with your health care provider. Document Released: 04/02/2009 Document Revised: 11/12/2015 Document Reviewed: 07/11/2014 Elsevier Interactive Patient Education  2017 [employer].

## 2019-07-19 ENCOUNTER — Other Ambulatory Visit: Payer: Self-pay | Admitting: Family Medicine

## 2019-07-19 DIAGNOSIS — M5136 Other intervertebral disc degeneration, lumbar region: Secondary | ICD-10-CM

## 2019-07-19 NOTE — Telephone Encounter (Signed)
Request refill for controlled medication, hydrocodone - acetaminophen.

## 2019-07-19 NOTE — Telephone Encounter (Signed)
Medication Refill - Medication: HYDROcodone-acetaminophen (NORCO) 7.5-325 MG tablet    Preferred Pharmacy (with phone number or street name):  [id number] - [place], [place] - [address] [place] [address] Phone:  [phone]  Fax:  [phone]       Agent: Please be advised that RX refills may take up to 3 business days. We ask that you follow-up with your pharmacy.  

## 2019-07-20 MED ORDER — HYDROCODONE-ACETAMINOPHEN 7.5-325 MG PO TABS: 1.0000 | ORAL_TABLET | Freq: Four times a day (QID) | ORAL | 0 refills | Status: DC | PRN

## 2019-07-22 ENCOUNTER — Telehealth: Payer: Self-pay

## 2019-07-22 NOTE — Telephone Encounter (Signed)
Patient called office LVM on Friday 07/19/19 in regards to not having someone to accompany her to have her colonoscopy.  I returned her call, advising her that she would need someone to accompany her and stay with her during her colonoscopy-in case something should happen.  She said her son is over 50 miles away. I asked her to discuss with her son some dates that he could make available to accompany her.  She said she would do so and call me back.  I then advised that we have mandatory COVID testing.  She would need to have someone to take her for this as well.  She pleasantly stated that she did not want to have a COVID test.  I explained that it is mandatory in order to schedule her colonoscopy.  She said she doesn't trust the test or the vaccine.  I told her she can call me back if she changes her mind.  I will create a reminder to touch base with her at a later time as well to schedule.  Thanks,  [place], [place]

## 2019-07-23 ENCOUNTER — Other Ambulatory Visit: Payer: Self-pay | Admitting: Family Medicine

## 2019-07-23 ENCOUNTER — Encounter: Payer: Self-pay | Admitting: *Deleted

## 2019-07-23 ENCOUNTER — Telehealth: Payer: Self-pay | Admitting: Family Medicine

## 2019-07-23 NOTE — Telephone Encounter (Signed)
Patient is calling to request a Prior Authorization from the insurance on Armodafinil 250 MG tablet [[id number]] Patient reports that she he [facility] complete insurance. Please advise Cb- [phone] Patient reports that she out of the medication

## 2019-07-23 NOTE — Telephone Encounter (Signed)
Copied from CRM [id number]. Topic: General - Other >> Jul 23, 2019  2:43 PM Jaquita Rector A wrote: Reason for CRM: Patient called to say that she would like her Rx for Armodafinil 250 MG tablet to be refilled at the pharmacy she is asking if prior authorization have been completed and if so she would like Rx refills to be for the rest of this year. Please call patient at  Ph# [phone]

## 2019-07-23 NOTE — Telephone Encounter (Signed)
[facility] faxed refill request for the following medications:  Armodafinil 250 MG tablet - 2nd request  Please advise.  Thanks, [employer]

## 2019-07-25 NOTE — Telephone Encounter (Signed)
Patient has called in for authorization for medications . She hasnt heard anything back and shes currently out of medication . Please advise

## 2019-07-26 NOTE — Telephone Encounter (Signed)
PA started today via covermymeds. Awaiting response from insurance.

## 2019-07-29 NOTE — Telephone Encounter (Signed)
PA approved. Patient advised.  

## 2019-08-08 ENCOUNTER — Other Ambulatory Visit: Payer: Self-pay | Admitting: Family Medicine

## 2019-08-09 ENCOUNTER — Other Ambulatory Visit: Payer: Self-pay | Admitting: Family Medicine

## 2019-08-14 ENCOUNTER — Other Ambulatory Visit: Payer: Medicare Other

## 2019-08-16 ENCOUNTER — Other Ambulatory Visit: Payer: Self-pay | Admitting: Family Medicine

## 2019-08-16 NOTE — Telephone Encounter (Signed)
Requested Prescriptions  Pending Prescriptions Disp Refills  . omeprazole (PRILOSEC) 20 MG capsule [Pharmacy Med Name: OMEPRAZOLE 20MG  CAPSULES] 90 capsule 0    Sig: TAKE 1 CAPSULE(20 MG) BY MOUTH DAILY     Gastroenterology: Proton Pump Inhibitors Passed - 08/16/2019  3:33 AM      Passed - Valid encounter within last 12 months    Recent Outpatient Visits          1 month ago Coronary artery disease involving native coronary artery of native heart without angina pectoris   [facility], MD   1 year ago Annual physical exam   [facility], MD   1 year ago Burning with urination   [facility] Chrismon, [facility], Jodell Cipro   2 years ago Annual physical exam   [facility], MD   3 years ago Transient neurologic deficit   [facility], MD      Future Appointments            In 1 month Fisher, Malva Limes, MD [facility], [facility]

## 2019-08-17 ENCOUNTER — Other Ambulatory Visit: Payer: Self-pay | Admitting: Family Medicine

## 2019-08-20 ENCOUNTER — Other Ambulatory Visit: Payer: Self-pay | Admitting: Family Medicine

## 2019-08-20 DIAGNOSIS — M5136 Other intervertebral disc degeneration, lumbar region: Secondary | ICD-10-CM

## 2019-08-20 MED ORDER — HYDROCODONE-ACETAMINOPHEN 7.5-325 MG PO TABS: 1.0000 | ORAL_TABLET | Freq: Four times a day (QID) | ORAL | 0 refills | Status: DC | PRN

## 2019-08-20 NOTE — Telephone Encounter (Signed)
Medication Refill - Medication: HYDROcodone-acetaminophen (NORCO) 7.5-325 MG tablet    Preferred Pharmacy (with phone number or street name):  [facility][id number] - [place], [place] - [address] [place] [address] AT [facility] [address] [facility] [address] Phone:  [phone]  Fax:  [phone]       Agent: Please be advised that RX refills may take up to 3 business days. We ask that you follow-up with your pharmacy.

## 2019-08-20 NOTE — Telephone Encounter (Signed)
Requested medication (s) are due for refill today: yes  Requested medication (s) are on the active medication list: yes  Last refill: 07/20/19  Future visit scheduled: yes  Notes to clinic:  not delegated    Requested Prescriptions  Pending Prescriptions Disp Refills   HYDROcodone-acetaminophen (NORCO) 7.5-325 MG tablet 120 tablet 0    Sig: Take 1 tablet by mouth every 6 (six) hours as needed for moderate pain.      Not Delegated - Analgesics:  Opioid Agonist Combinations Failed - 08/20/2019 12:06 PM      Failed - This refill cannot be delegated      Failed - Urine Drug Screen completed in last 360 days.      Passed - Valid encounter within last 6 months    Recent Outpatient Visits           1 month ago Coronary artery disease involving native coronary artery of native heart without angina pectoris   [facility], [facility]   1 year ago Annual physical exam   [facility] Malva Limes, MD   1 year ago Burning with urination   [facility], Jodell Cipro, [place]   2 years ago Annual physical exam   [facility] Malva Limes, MD   3 years ago Transient neurologic deficit   [facility] Malva Limes, MD       Future Appointments             In 1 month Fisher, Demetrios Isaacs, MD [facility], [facility]

## 2019-08-21 ENCOUNTER — Other Ambulatory Visit: Payer: Self-pay | Admitting: Family Medicine

## 2019-09-17 ENCOUNTER — Other Ambulatory Visit: Payer: Self-pay | Admitting: Family Medicine

## 2019-09-17 DIAGNOSIS — M5136 Other intervertebral disc degeneration, lumbar region: Secondary | ICD-10-CM

## 2019-09-17 NOTE — Telephone Encounter (Signed)
Requested medications are due for refill today? This medication cannot be delegated.    Requested medications are on active medication list?  Yes  Last Refill:   08/21/2019    # 30 with no refills  Future visit scheduled?  Yes  Notes to Clinic:

## 2019-09-17 NOTE — Telephone Encounter (Signed)
Copied from CRM [id number]. Topic: Quick Communication - Rx Refill/Question >> Sep 17, 2019  4:52 PM Dalphine Handing A wrote: Medication:Armodafinil 250 MG tablet ,HYDROcodone-acetaminophen (NORCO) 7.5-325 MG tablet   Has the patient contacted their pharmacy? {Yes (Agent: If no, request that the patient contact the pharmacy for the refill.) (Agent: If yes, when and what did the pharmacy advise?)Contact PCP  Preferred Pharmacy (with phone number or street name): [facility] #[id number] - [place], [place] - [address] [place] AT [facility] OF [address] & [facility] [address]  Phone:  [phone] Fax:  [phone]     Agent: Please be advised that RX refills may take up to 3 business days. We ask that you follow-up with your pharmacy.

## 2019-09-17 NOTE — Telephone Encounter (Signed)
Requested medication (s) are due for refill today: yes to both  Requested medication (s) are on the active medication list: yes to both   Last refill:  Norco: 08/20/19     Armodafinil: 08/21/19  Future visit scheduled: yes  Notes to clinic:  Neither med can be delegated to NT to refill   Requested Prescriptions  Pending Prescriptions Disp Refills   HYDROcodone-acetaminophen (NORCO) 7.5-325 MG tablet 120 tablet 0    Sig: Take 1 tablet by mouth every 6 (six) hours as needed for moderate pain.      Not Delegated - Analgesics:  Opioid Agonist Combinations Failed - 09/17/2019  4:58 PM      Failed - This refill cannot be delegated      Failed - Urine Drug Screen completed in last 360 days.      Passed - Valid encounter within last 6 months    Recent Outpatient Visits           2 months ago Coronary artery disease involving native coronary artery of native heart without angina pectoris   [facility] Malva Limes, MD   1 year ago Annual physical exam   [facility] Malva Limes, MD   1 year ago Burning with urination   [facility] Chrismon, Jodell Cipro, [place]   2 years ago Annual physical exam   [facility] Malva Limes, MD   3 years ago Transient neurologic deficit   [facility] Malva Limes, MD       Future Appointments             In 2 weeks Sherrie Mustache, Demetrios Isaacs, MD [facility], [facility]              Armodafinil 250 MG tablet 30 tablet 0      Not Delegated - Psychiatry:  Stimulants/ADHD Failed - 09/17/2019  4:58 PM      Failed - This refill cannot be delegated      Failed - Urine Drug Screen completed in last 360 days.      Passed - Valid encounter within last 3 months    Recent Outpatient Visits           2 months ago Coronary artery disease involving native coronary artery of native heart without angina pectoris   [facility] Malva Limes, MD   1 year ago  Annual physical exam   [facility] Malva Limes, MD   1 year ago Burning with urination   [facility] Chrismon, Jodell Cipro, [place]   2 years ago Annual physical exam   [facility] Malva Limes, MD   3 years ago Transient neurologic deficit   [facility] Malva Limes, MD       Future Appointments             In 2 weeks Fisher, Demetrios Isaacs, MD [facility], [facility]

## 2019-09-18 MED ORDER — HYDROCODONE-ACETAMINOPHEN 7.5-325 MG PO TABS: 1.0000 | ORAL_TABLET | Freq: Four times a day (QID) | ORAL | 0 refills | Status: DC | PRN

## 2019-10-01 DIAGNOSIS — M170 Bilateral primary osteoarthritis of knee: Secondary | ICD-10-CM | POA: Diagnosis not present

## 2019-10-03 NOTE — Progress Notes (Signed)
Established patient visit     Patient: Morgan Lynch   DOB: 1945/08/19   74 y.o. Female  MRN: [id number] Visit Date: 10/04/2019  Today's healthcare provider: Mila Merry, MD  Subjective:    Chief Complaint  Patient presents with  . Hyperlipidemia  . Hypertension   I,Latasha Walston,acting as a scribe for Mila Merry, MD.,have documented all relevant documentation on the behalf of Mila Merry, MD,as directed by  Mila Merry, MD while in the presence of Mila Merry, MD.  HPI  Lipid/Cholesterol, Follow-up  Last Lipid Panel:    Component Value Date/Time   CHOL 282 (H) 04/23/2019 1444   TRIG 225 (H) 04/23/2019 1444   HDL 48 04/23/2019 1444   CHOLHDL 5.9 (H) 04/23/2019 1444   CHOLHDL 5.3 (H) 05/23/2017 1613   LDLCALC 191 (H) 04/23/2019 1444   LDLCALC 210 (H) 05/23/2017 1613    She was last seen for this 3 months ago.  Management since that visit includes started Colestid 1 g tablet. She had previously been prescribed multiple statins and Repatha, all of which she was intolerant too. She had also been prescribed ezetimibe recently which she stopped due to side effects.  She reports fair compliance with treatment. Patient does not take regularly. She states she read the directions she said not take any other medications for 4 hours before or 1 hour after taking the Colestid She is not having side effects.  Symptoms: No chest pain No chest pressure/discomfort No dyspnea No lower extremity edema No numbness or tingling of extremity No orthopnea No palpitations No paroxysmal nocturnal dyspnea No speech difficulty No syncope  Current diet: in general, a "healthy" diet   Current exercise: none  Wt Readings from Last 3 Encounters:  10/04/19 205 lb (93 kg)  06/24/19 199 lb (90.3 kg)  05/24/18 197 lb (89.4 kg)   The ASCVD Risk score Denman George DC Jr., et al., 2013) failed to calculate for the following reasons:   The patient has a prior MI or stroke  diagnosis  ----------------------------------------------------------------------------------------- Hypertension, follow-up  BP Readings from Last 3 Encounters:  10/04/19 133/78  06/24/19 138/72  05/24/18 136/64   Wt Readings from Last 3 Encounters:  10/04/19 205 lb (93 kg)  06/24/19 199 lb (90.3 kg)  05/24/18 197 lb (89.4 kg)   She was last seen for hypertension 3 months ago.  BP at that visit was 138/72. Management since that visit includes no changes. She reports good compliance with treatment. She is not having side effects.  She is not exercising. She is adherent to low salt diet.   Outside blood pressures are being checked at home. Symptoms: No chest pain No chest pressure/discomfort No dyspnea (difficulty breathing) No lower extremity edema No orthopnea No palpitations No paroxysmal nocturnal dyspnea  No syncope  She does not smoke. She is following a Regular diet. Use of agents associated with hypertension: none.   Last basic metabolic panel Lab Results  Component Value Date   GLUCOSE 98 04/23/2019   NA 139 04/23/2019   K 4.2 04/23/2019   CL 107 (H) 04/23/2019   CO2 19 (L) 04/23/2019   BUN 19 04/23/2019   CREATININE 1.16 (H) 04/23/2019   GFRNONAA 47 (L) 04/23/2019   GFRAA 54 (L) 04/23/2019   CALCIUM 10.2 04/23/2019   Last lipids Lab Results  Component Value Date   CHOL 282 (H) 04/23/2019   HDL 48 04/23/2019   LDLCALC 191 (H) 04/23/2019   TRIG 225 (H) 04/23/2019   CHOLHDL  5.9 (H) 04/23/2019    The ASCVD Risk score Mikey Bussing DC Jr., et al., 2013) failed to calculate for the following reasons:   The patient has a prior MI or stroke diagnosis   -----------------------------------------------------------------------------------------     Medications: Outpatient Medications Prior to Visit  Medication Sig  . albuterol (PROAIR HFA) 108 (90 Base) MCG/ACT inhaler USE 2 PUFFS BY INHALATION EVERY 6 HOURS AS NEEDED  . Armodafinil 250 MG tablet TAKE 1  TABLET(250 MG) BY MOUTH DAILY  . aspirin 81 MG tablet Take 81 mg by mouth daily.   Marland Kitchen atenolol-chlorthalidone (TENORETIC) 50-25 MG tablet TAKE 1/2 TABLET BY MOUTH DAILY  . Biotin 1 MG CAPS Take by mouth daily.   . Cholecalciferol 125 MCG (5000 UT) capsule Take by mouth as directed. Takes 10 tablets once a week.  . diphenhydrAMINE (BENADRYL) 25 MG tablet Take by mouth every 6 (six) hours as needed.   Marland Kitchen EPINEPHRINE 0.3 mg/0.3 mL IJ SOAJ injection USE AS DIRECTED  . Flaxseed, Linseed, (FLAX SEED OIL) 1000 MG CAPS Take 1,000 mg by mouth daily.  Marland Kitchen FLUoxetine (PROZAC) 20 MG capsule TAKE 3 CAPSULES(60 MG) BY MOUTH DAILY  . fluticasone (FLONASE) 50 MCG/ACT nasal spray SHAKE LIQUID AND USE 2 SPRAYS IN EACH NOSTRIL EVERY NIGHT AT BEDTIME  . HYDROcodone-acetaminophen (NORCO) 7.5-325 MG tablet Take 1 tablet by mouth every 6 (six) hours as needed for moderate pain.  Marland Kitchen ipratropium (ATROVENT) 0.06 % nasal spray USE 2 SPRAYS IN EACH NOSTRIL THREE TIMES DAILY  . levothyroxine (SYNTHROID) 125 MCG tablet TAKE 1 TABLET(125 MCG) BY MOUTH DAILY  . Magnesium 500 MG CAPS Take by mouth 2 (two) times daily.   . montelukast (SINGULAIR) 10 MG tablet Take 1 tablet (10 mg total) by mouth daily.  . Multiple Vitamins-Minerals (CENTRUM SILVER 50+MEN) TABS Take by mouth daily.   . naproxen (NAPROSYN) 500 MG tablet TAKE 1 TABLET(500 MG) BY MOUTH TWICE DAILY AS NEEDED  . omeprazole (PRILOSEC) 20 MG capsule TAKE 1 CAPSULE(20 MG) BY MOUTH DAILY  . PRESCRIPTION MEDICATION CPAP every night  . pyridoxine (B-6) 100 MG tablet Take by mouth daily. Unsure dose - takes occasionally  . tiZANidine (ZANAFLEX) 4 MG tablet TAKE 1 TABLET BY MOUTH EVERY 8 HOURS AS NEEDED  . topiramate (TOPAMAX) 100 MG tablet TAKE 1 TABLET BY MOUTH TWICE DAILY  . triamcinolone cream (KENALOG) 0.5 % APPLY EXTERNALLY TO THE AFFECTED AREA DAILY AS NEEDED  . Vitamin D, Ergocalciferol, (DRISDOL) 50000 UNITS CAPS capsule Take 50,000 Units by mouth every 7 (seven) days.    . colestipol (COLESTID) 1 g tablet Take 1 tablet (1 g total) by mouth 2 (two) times daily. (Patient not taking: Reported on 07/17/2019)   No facility-administered medications prior to visit.    Review of Systems       Objective:    BP 133/78 (BP Location: Right Arm, Patient Position: Sitting, Cuff Size: Large)   Pulse 66   Temp (!) 96.2 F (35.7 C) (Temporal)   Ht 5\' 2"  (1.575 m)   Wt 205 lb (93 kg)   BMI 37.49 kg/m     Physical Exam   General: Appearance:    Obese female in no acute distress  Eyes:    PERRL, conjunctiva/corneas clear, EOM's intact       Lungs:     Clear to auscultation bilaterally, respirations unlabored  Heart:    Normal heart rate. Normal rhythm. No murmurs, rubs, or gallops.   MS:   All extremities are intact.  Neurologic:   Awake, alert, oriented x 3. No apparent focal neurological           defect.       No results found for any visits on 10/04/19.    Assessment & Plan:  1. Familial hypercholesteremia Previously uncontrolled, off of ezetimibe, has not been taking Colestid consistently. Counseled that she is at very high risk for cardiac and neurovascular disease. She has been intolerant to multiple statins, Repatha, and ezetimibe, but tolerated Colestid well. Advised she can take this along with her other medications and will call her to check lipids again in about a month.   2. Essential hypertension Well controlled Continue current medications   No follow-ups on file.        Mila Merry, MD  [facility] [phone] (phone) [phone] (fax)  [facility]

## 2019-10-04 ENCOUNTER — Encounter: Payer: Self-pay | Admitting: Family Medicine

## 2019-10-04 ENCOUNTER — Other Ambulatory Visit: Payer: Self-pay

## 2019-10-04 ENCOUNTER — Ambulatory Visit (INDEPENDENT_AMBULATORY_CARE_PROVIDER_SITE_OTHER): Payer: Medicare Other | Admitting: Family Medicine

## 2019-10-04 VITALS — BP 133/78 | HR 66 | Temp 96.2°F | Ht 62.0 in | Wt 205.0 lb

## 2019-10-04 DIAGNOSIS — E7801 Familial hypercholesterolemia: Secondary | ICD-10-CM

## 2019-10-04 DIAGNOSIS — I10 Essential (primary) hypertension: Secondary | ICD-10-CM

## 2019-10-08 ENCOUNTER — Other Ambulatory Visit: Payer: Self-pay | Admitting: Family Medicine

## 2019-10-08 NOTE — Telephone Encounter (Signed)
Requested Prescriptions  Pending Prescriptions Disp Refills  . triamcinolone cream (KENALOG) 0.5 % [Pharmacy Med Name: TRIAMCINOLONE 0.5% CREAM 15GM] 30 g 3    Sig: APPLY EXTERNALLY TO THE AFFECTED AREA DAILY AS NEEDED     Dermatology:  Corticosteroids Passed - 10/08/2019  3:33 AM      Passed - Valid encounter within last 12 months    Recent Outpatient Visits          4 days ago Familial hypercholesteremia   [facility] Malva Limes, MD   3 months ago Coronary artery disease involving native coronary artery of native heart without angina pectoris   [facility] Malva Limes, MD   1 year ago Annual physical exam   [facility] Malva Limes, MD   1 year ago Burning with urination   [facility] Chrismon, Jodell Cipro, [place]   2 years ago Annual physical exam   [facility] Malva Limes, MD

## 2019-10-16 ENCOUNTER — Other Ambulatory Visit: Payer: Self-pay | Admitting: Family Medicine

## 2019-10-16 DIAGNOSIS — M5136 Other intervertebral disc degeneration, lumbar region: Secondary | ICD-10-CM

## 2019-10-16 NOTE — Telephone Encounter (Signed)
Requested medication (s) are due for refill today: yes  Requested medication (s) are on the active medication list: yes  Last refill:  09/18/19  Future visit scheduled: yes  Notes to clinic: not delegated    Requested Prescriptions  Pending Prescriptions Disp Refills   HYDROcodone-acetaminophen (NORCO) 7.5-325 MG tablet 120 tablet 0    Sig: Take 1 tablet by mouth every 6 (six) hours as needed for moderate pain.      Not Delegated - Analgesics:  Opioid Agonist Combinations Failed - 10/16/2019 12:45 PM      Failed - This refill cannot be delegated      Failed - Urine Drug Screen completed in last 360 days.      Passed - Valid encounter within last 6 months    Recent Outpatient Visits           1 week ago Familial hypercholesteremia   [facility] Malva Limes, MD   3 months ago Coronary artery disease involving native coronary artery of native heart without angina pectoris   [facility] Malva Limes, MD   1 year ago Annual physical exam   [facility] Malva Limes, MD   1 year ago Burning with urination   [facility] Chrismon, Jodell Cipro, [place]   2 years ago Annual physical exam   [facility] Malva Limes, MD

## 2019-10-16 NOTE — Telephone Encounter (Signed)
Medication Refill - Medication: HYDROcodone-acetaminophen (NORCO) 7.5-325 MG tablet   Has the patient contacted their pharmacy? No. (Agent: If no, request that the patient contact the pharmacy for the refill.) (Agent: If yes, when and what did the pharmacy advise?)  Preferred Pharmacy (with phone number or street name):  [facility] #[id number],  - [address] AT [facility] [address] [facility] [address] Phone:  [phone]  Fax:  [phone]       Agent: Please be advised that RX refills may take up to 3 business days. We ask that you follow-up with your pharmacy.

## 2019-10-18 ENCOUNTER — Other Ambulatory Visit: Payer: Self-pay

## 2019-10-18 DIAGNOSIS — M5136 Other intervertebral disc degeneration, lumbar region: Secondary | ICD-10-CM

## 2019-10-18 MED ORDER — HYDROCODONE-ACETAMINOPHEN 7.5-325 MG PO TABS: 1.0000 | ORAL_TABLET | Freq: Four times a day (QID) | ORAL | 0 refills | Status: DC | PRN

## 2019-10-18 NOTE — Telephone Encounter (Signed)
Patient is requesting refill on Hydrocodone be sent to [facility] pharmacy.

## 2019-10-18 NOTE — Telephone Encounter (Signed)
Copied from CRM [id number]. Topic: General - Other >> Oct 18, 2019  9:31 AM Dalphine Handing A wrote: Patient requesting callback from Dr. Esmond Camper nurse in regards to prescription for pain medication being signed off on. Please advise

## 2019-10-19 ENCOUNTER — Other Ambulatory Visit: Payer: Self-pay | Admitting: Family Medicine

## 2019-10-19 NOTE — Telephone Encounter (Signed)
Requested  medications are  due for refill today yes  Requested medications are on the active medication list yes  Last refill 2/18  Future visit scheduled yes in 2022  Notes to clinic Not delegated

## 2019-10-20 ENCOUNTER — Other Ambulatory Visit: Payer: Self-pay | Admitting: Family Medicine

## 2019-10-20 NOTE — Telephone Encounter (Signed)
Requested Prescriptions  Pending Prescriptions Disp Refills  . albuterol (VENTOLIN HFA) 108 (90 Base) MCG/ACT inhaler [Pharmacy Med Name: ALBUTEROL HFA INH (200 PUFFS)8.5GM] 8.5 g 6    Sig: INHALE 2 PUFFS BY MOUTH EVERY 6 HOURS AS NEEDED     Pulmonology:  Beta Agonists Failed - 10/20/2019  3:34 AM      Failed - One inhaler should last at least one month. If the patient is requesting refills earlier, contact the patient to check for uncontrolled symptoms.      Passed - Valid encounter within last 12 months    Recent Outpatient Visits          2 weeks ago Familial hypercholesteremia   [facility] Malva Limes, MD   3 months ago Coronary artery disease involving native coronary artery of native heart without angina pectoris   [facility] Malva Limes, MD   1 year ago Annual physical exam   [facility] Malva Limes, MD   1 year ago Burning with urination   [facility] Chrismon, Jodell Cipro, [place]   2 years ago Annual physical exam   [facility] Malva Limes, MD

## 2019-11-05 ENCOUNTER — Telehealth: Payer: Self-pay | Admitting: Family Medicine

## 2019-11-05 DIAGNOSIS — E7801 Familial hypercholesterolemia: Secondary | ICD-10-CM

## 2019-11-05 NOTE — Telephone Encounter (Signed)
Please advise patient it is time to recheck lipids since restarting Colestid last month. Please leave order at lab. thanks

## 2019-11-05 NOTE — Telephone Encounter (Signed)
Attempted to contact patient, no answer left a voicemail. Okay for [facility] to advise patient.  

## 2019-11-06 NOTE — Telephone Encounter (Signed)
Attempted to call patinet- left message to call office for message from PCP

## 2019-11-06 NOTE — Telephone Encounter (Signed)
Pt. Called back. States she took the Colestid for 2 weeks and had severe reaction. States she had severe abdominal cramping and felt "like I was not in my body." Reports she has changed her diet, and "I don't think I need my cholesterol checked right now." Please advise.

## 2019-11-15 ENCOUNTER — Other Ambulatory Visit: Payer: Self-pay | Admitting: Family Medicine

## 2019-11-25 DIAGNOSIS — M170 Bilateral primary osteoarthritis of knee: Secondary | ICD-10-CM | POA: Diagnosis not present

## 2019-11-25 DIAGNOSIS — M1711 Unilateral primary osteoarthritis, right knee: Secondary | ICD-10-CM | POA: Diagnosis not present

## 2019-12-18 ENCOUNTER — Other Ambulatory Visit: Payer: Self-pay | Admitting: Family Medicine

## 2019-12-18 ENCOUNTER — Telehealth: Payer: Self-pay | Admitting: Family Medicine

## 2019-12-18 DIAGNOSIS — M5136 Other intervertebral disc degeneration, lumbar region: Secondary | ICD-10-CM

## 2019-12-18 MED ORDER — HYDROCODONE-ACETAMINOPHEN 7.5-325 MG PO TABS: 1.0000 | ORAL_TABLET | Freq: Four times a day (QID) | ORAL | 0 refills | Status: DC | PRN

## 2019-12-18 NOTE — Telephone Encounter (Signed)
Medication Refill - Medication: Hydrocodone   Has the patient contacted their pharmacy? Yes.   (Agent: If no, request that the patient contact the pharmacy for the refill.) (Agent: If yes, when and what did the pharmacy advise?)  Preferred Pharmacy (with phone number or street name):  [facility] #[id number] [facility] [place] - [address] AT [facility] [address] [place] [address]  [address] [place] [address]  Phone: [phone] Fax: [phone]  Hours: Not open 24 hours     Agent: Please be advised that RX refills may take up to 3 business days. We ask that you follow-up with your pharmacy.

## 2019-12-18 NOTE — Telephone Encounter (Signed)
Pt called stating that she is needing a PA for armodafinil. Pt states that the previous PA only lasts until July. Please advise.

## 2019-12-18 NOTE — Telephone Encounter (Signed)
Patient will contact pharmacy to request PA be faxed to [place].

## 2020-01-02 ENCOUNTER — Telehealth: Payer: Self-pay | Admitting: Family Medicine

## 2020-01-02 NOTE — Telephone Encounter (Signed)
PA completed via covermymeds. Awaiting response 

## 2020-01-02 NOTE — Telephone Encounter (Signed)
I called [employer] and gave additional information (dx of obstructive sleep apnea) that was requested. Prior authorization has been re-approved until 07/04/2020. Approval letter will be faxed to our office.   Ref# [id number]

## 2020-01-02 NOTE — Telephone Encounter (Signed)
[employer] called about the PA for Armodafinil 250 MG tablet They wanted to know if the Pt has any other conditions/diagnoses such as obstructive sleep apnea/ the diagnoses provided to them was hypersomnia and they needed another diagnoses/ they will send a fax to office   / please advise

## 2020-01-16 ENCOUNTER — Other Ambulatory Visit: Payer: Self-pay | Admitting: Family Medicine

## 2020-01-16 DIAGNOSIS — M5136 Other intervertebral disc degeneration, lumbar region: Secondary | ICD-10-CM

## 2020-01-16 NOTE — Telephone Encounter (Signed)
Refill due  

## 2020-01-17 MED ORDER — HYDROCODONE-ACETAMINOPHEN 7.5-325 MG PO TABS: 1.0000 | ORAL_TABLET | Freq: Four times a day (QID) | ORAL | 0 refills | Status: DC | PRN

## 2020-01-17 NOTE — Telephone Encounter (Signed)
Requested  medications are  due for refill today yes  Requested medications are on the active medication list yes  Last visit April 2021  Future visit scheduled Feb 2022  Notes to clinic Not Delegated

## 2020-02-13 ENCOUNTER — Other Ambulatory Visit: Payer: Self-pay | Admitting: Family Medicine

## 2020-02-14 ENCOUNTER — Other Ambulatory Visit: Payer: Self-pay | Admitting: Family Medicine

## 2020-02-14 DIAGNOSIS — M5136 Other intervertebral disc degeneration, lumbar region: Secondary | ICD-10-CM

## 2020-02-14 MED ORDER — HYDROCODONE-ACETAMINOPHEN 7.5-325 MG PO TABS: 1.0000 | ORAL_TABLET | Freq: Four times a day (QID) | ORAL | 0 refills | Status: DC | PRN

## 2020-02-14 NOTE — Telephone Encounter (Signed)
Requested medication (s) are due for refill today: yes  Requested medication (s) are on the active medication list: yes  Last refill:  01/17/2020  Future visit scheduled:  yes  Notes to clinic:  this refill cannot be delegated    Requested Prescriptions  Pending Prescriptions Disp Refills   HYDROcodone-acetaminophen (NORCO) 7.5-325 MG tablet 120 tablet 0    Sig: Take 1 tablet by mouth every 6 (six) hours as needed for moderate pain.      Not Delegated - Analgesics:  Opioid Agonist Combinations Failed - 02/14/2020  9:08 AM      Failed - This refill cannot be delegated      Failed - Urine Drug Screen completed in last 360 days.      Passed - Valid encounter within last 6 months    Recent Outpatient Visits           4 months ago Familial hypercholesteremia   [facility] Malva Limes, MD   7 months ago Coronary artery disease involving native coronary artery of native heart without angina pectoris   [facility] Malva Limes, MD   1 year ago Annual physical exam   [facility] Malva Limes, MD   1 year ago Burning with urination   [facility] Chrismon, Jodell Cipro, [place]   2 years ago Annual physical exam   [facility] Malva Limes, MD

## 2020-02-14 NOTE — Telephone Encounter (Signed)
Medication Refill - Medication: HYDROcodone-acetaminophen (NORCO) 7.5-325 MG tablet    Has the patient contacted their pharmacy? yes (Agent: If no, request that the patient contact the pharmacy for the refill.) (Agent: If yes, when and what did the pharmacy advise?)Contact PCP  Preferred Pharmacy (with phone number or street name):  [facility] #[id number] - [place], [place] - [address] [place] [address] AT [facility] [address] [facility] [address] Phone:  [phone]  Fax:  [phone]       Agent: Please be advised that RX refills may take up to 3 business days. We ask that you follow-up with your pharmacy.

## 2020-02-17 ENCOUNTER — Other Ambulatory Visit: Payer: Self-pay | Admitting: Family Medicine

## 2020-02-17 DIAGNOSIS — E039 Hypothyroidism, unspecified: Secondary | ICD-10-CM

## 2020-03-17 ENCOUNTER — Other Ambulatory Visit: Payer: Self-pay | Admitting: Family Medicine

## 2020-03-17 DIAGNOSIS — M5136 Other intervertebral disc degeneration, lumbar region: Secondary | ICD-10-CM

## 2020-03-17 MED ORDER — HYDROCODONE-ACETAMINOPHEN 7.5-325 MG PO TABS: 1.0000 | ORAL_TABLET | Freq: Four times a day (QID) | ORAL | 0 refills | Status: DC | PRN

## 2020-03-17 NOTE — Telephone Encounter (Signed)
Requested medication (s) are due for refill today: yes  Requested medication (s) are on the active medication list: yes  Last refill:  02/14/20  Future visit scheduled: no  Notes to clinic:  med not delegated to NT to RF   Requested Prescriptions  Pending Prescriptions Disp Refills   HYDROcodone-acetaminophen (NORCO) 7.5-325 MG tablet 120 tablet 0    Sig: Take 1 tablet by mouth every 6 (six) hours as needed for moderate pain.      Not Delegated - Analgesics:  Opioid Agonist Combinations Failed - 03/17/2020  1:19 PM      Failed - This refill cannot be delegated      Failed - Urine Drug Screen completed in last 360 days.      Passed - Valid encounter within last 6 months    Recent Outpatient Visits           5 months ago Familial hypercholesteremia   [facility] Malva Limes, MD   8 months ago Coronary artery disease involving native coronary artery of native heart without angina pectoris   [facility] Malva Limes, MD   1 year ago Annual physical exam   [facility] Malva Limes, MD   1 year ago Burning with urination   [facility] Chrismon, Jodell Cipro, [place]   2 years ago Annual physical exam   [facility] Malva Limes, MD

## 2020-03-17 NOTE — Telephone Encounter (Signed)
Medication Refill - Medication: HYDROcodone-acetaminophen (NORCO) 7.5-325 MG tablet    Has the patient contacted their pharmacy?yes (Agent: If no, request that the patient contact the pharmacy for the refill.) (Agent: If yes, when and what did the pharmacy advise?)Contact pcp  Preferred Pharmacy (with phone number or street name):  [facility] #[id number] - [place], [place] - [address] [place] [address] AT [facility] [address] & [facility] [address] Phone:  [phone]  Fax:  [phone]       Agent: Please be advised that RX refills may take up to 3 business days. We ask that you follow-up with your pharmacy.

## 2020-03-20 ENCOUNTER — Other Ambulatory Visit: Payer: Self-pay | Admitting: Family Medicine

## 2020-03-20 NOTE — Telephone Encounter (Signed)
Requested medication (s) are due for refill today: yes  Requested medication (s) are on the active medication list: yes  Last refill:  02/17/20  Future visit scheduled: no  Notes to clinic:  not delegated    Requested Prescriptions  Pending Prescriptions Disp Refills   Armodafinil 250 MG tablet [Pharmacy Med Name: ARMODAFINIL 250MG  TABLETS] 30 tablet     Sig: TAKE 1 TABLET(250 MG) BY MOUTH DAILY      Not Delegated - Psychiatry:  Stimulants/ADHD Failed - 03/20/2020  6:55 AM      Failed - This refill cannot be delegated      Failed - Urine Drug Screen completed in last 360 days.      Failed - Valid encounter within last 3 months    Recent Outpatient Visits           5 months ago Familial hypercholesteremia   [facility], MD   9 months ago Coronary artery disease involving native coronary artery of native heart without angina pectoris   [facility], MD   1 year ago Annual physical exam   [facility], MD   1 year ago Burning with urination   [facility] Chrismon, [facility], Jodell Cipro   2 years ago Annual physical exam   [facility], MD

## 2020-04-14 ENCOUNTER — Other Ambulatory Visit: Payer: Self-pay | Admitting: Family Medicine

## 2020-04-14 DIAGNOSIS — M5136 Other intervertebral disc degeneration, lumbar region: Secondary | ICD-10-CM

## 2020-04-14 MED ORDER — HYDROCODONE-ACETAMINOPHEN 7.5-325 MG PO TABS: 1.0000 | ORAL_TABLET | Freq: Four times a day (QID) | ORAL | 0 refills | Status: DC | PRN

## 2020-04-14 NOTE — Telephone Encounter (Signed)
Patient requesting HYDROcodone-acetaminophen (NORCO) 7.5-325 MG tablet, informed please allow 48 to 72 hour turn around time  [facility] #[id number] [facility], [place] - [address] [place] [address] AT [facility] [address] [facility] [address] Phone:  [phone]  Fax:  [phone]

## 2020-04-14 NOTE — Telephone Encounter (Signed)
Requested medication (s) are due for refill today: yes  Requested medication (s) are on the active medication list: yes  Last refill: 03/17/20  Future visit scheduled: no  Notes to clinic:  not delegated; no valid encounter within last 6 months    Requested Prescriptions  Pending Prescriptions Disp Refills   HYDROcodone-acetaminophen (NORCO) 7.5-325 MG tablet 120 tablet 0    Sig: Take 1 tablet by mouth every 6 (six) hours as needed for moderate pain.      Not Delegated - Analgesics:  Opioid Agonist Combinations Failed - 04/14/2020 10:28 AM      Failed - This refill cannot be delegated      Failed - Urine Drug Screen completed in last 360 days.      Failed - Valid encounter within last 6 months    Recent Outpatient Visits           6 months ago Familial hypercholesteremia   [facility] Mal[facility] Limes, [facility]   9 months ago Coronary artery disease involving native coronary artery of native heart without angina pectoris   [facility] Malva Limes, MD   1 year ago Annual physical exam   [facility] Malva Limes, MD   1 year ago Burning with urination   [facility] Chrismon, Jodell Cipro, [place]   2 years ago Annual physical exam   [facility] Malva Limes, MD

## 2020-05-13 ENCOUNTER — Other Ambulatory Visit: Payer: Self-pay | Admitting: Family Medicine

## 2020-05-13 DIAGNOSIS — M5136 Other intervertebral disc degeneration, lumbar region: Secondary | ICD-10-CM

## 2020-05-13 NOTE — Telephone Encounter (Signed)
Requested medication (s) are due for refill today:  Yes  Requested medication (s) are on the active medication list:  Yes  Future visit scheduled:  No  Last Refill: 04/14/20 : #120/ no refills  Notes to clinic:  phone call to pt. To remind of need for f/u appt; left vm to call and schedule this; medication is not delegated.   Requested Prescriptions  Pending Prescriptions Disp Refills   HYDROcodone-acetaminophen (NORCO) 7.5-325 MG tablet 120 tablet 0    Sig: Take 1 tablet by mouth every 6 (six) hours as needed for moderate pain.      Not Delegated - Analgesics:  Opioid Agonist Combinations Failed - 05/13/2020 11:16 AM      Failed - This refill cannot be delegated      Failed - Urine Drug Screen completed in last 360 days      Failed - Valid encounter within last 6 months    Recent Outpatient Visits           7 months ago Familial hypercholesteremia   [facility] Malva Limes, MD   10 months ago Coronary artery disease involving native coronary artery of native heart without angina pectoris   [facility] Malva Limes, MD   1 year ago Annual physical exam   [facility] Malva Limes, MD   1 year ago Burning with urination   [facility] Chrismon, Jodell Cipro, [place]   2 years ago Annual physical exam   [facility] Malva Limes, MD

## 2020-05-13 NOTE — Telephone Encounter (Signed)
Medication Refill - Medication: HYDROcodone-acetaminophen (NORCO) 7.5-325 MG tablet   Pt called and is requesting to have the Rx state that she needs every 4 hrs, instead of every 6 please advise   Has the patient contacted their pharmacy? Yes.   (Agent: If no, request that the patient contact the pharmacy for the refill.) (Agent: If yes, when and what did the pharmacy advise?)  Preferred Pharmacy (with phone number or street name):  [facility] #[id number] [facility], [place] - [address] [place] [address] AT [facility] [address] [place] [address]  [address] [place] [address] [place] [address]  Phone: [phone] Fax: [phone]     Agent: Please be advised that RX refills may take up to 3 business days. We ask that you follow-up with your pharmacy.

## 2020-05-15 MED ORDER — HYDROCODONE-ACETAMINOPHEN 7.5-325 MG PO TABS: 1.0000 | ORAL_TABLET | Freq: Four times a day (QID) | ORAL | 0 refills | Status: DC | PRN

## 2020-05-19 ENCOUNTER — Other Ambulatory Visit: Payer: Self-pay | Admitting: Family Medicine

## 2020-06-14 ENCOUNTER — Other Ambulatory Visit: Payer: Self-pay | Admitting: Family Medicine

## 2020-06-15 ENCOUNTER — Other Ambulatory Visit: Payer: Self-pay | Admitting: Family Medicine

## 2020-06-15 DIAGNOSIS — M5136 Other intervertebral disc degeneration, lumbar region: Secondary | ICD-10-CM

## 2020-06-15 MED ORDER — HYDROCODONE-ACETAMINOPHEN 7.5-325 MG PO TABS: 1.0000 | ORAL_TABLET | Freq: Four times a day (QID) | ORAL | 0 refills | Status: DC | PRN

## 2020-06-15 NOTE — Telephone Encounter (Signed)
Copied from CRM [id number]. Topic: Quick Communication - Rx Refill/Question >> Jun 15, 2020  2:37 PM Gaetana Michaelis A wrote: Medication: HYDROcodone-acetaminophen (NORCO) 7.5-325 MG tablet  Has the patient contacted their pharmacy? Yes, will not refill without authorization  Preferred Pharmacy (with phone number or street name): [facility] #[id number] [facility] - [place], [place] - [address] [place] [address] Phone:  [phone]   Agent: Please be advised that RX refills may take up to 3 business days. We ask that you follow-up with your pharmacy.

## 2020-06-15 NOTE — Telephone Encounter (Signed)
Requested medication (s) are due for refill today:   Provider to determine  Requested medication (s) are on the active medication list:   Yes  Future visit scheduled:   No   Last ordered: 05/15/2020 #120, 0 refills  Non delegated refill   Requested Prescriptions  Pending Prescriptions Disp Refills   HYDROcodone-acetaminophen (NORCO) 7.5-325 MG tablet 120 tablet 0    Sig: Take 1 tablet by mouth every 6 (six) hours as needed for moderate pain.      Not Delegated - Analgesics:  Opioid Agonist Combinations Failed - 06/15/2020  2:49 PM      Failed - This refill cannot be delegated      Failed - Urine Drug Screen completed in last 360 days      Failed - Valid encounter within last 6 months    Recent Outpatient Visits           8 months ago Familial hypercholesteremia   [facility] Mal[facility] Limes, MD   11 months ago Coronary artery disease involving native coronary artery of native heart without angina pectoris   [facility] Malva Limes, MD   2 years ago Annual physical exam   [facility] Malva Limes, MD   2 years ago Burning with urination   [facility] Chrismon, Jodell Cipro, PA-C   3 years ago Annual physical exam   [facility] Malva Limes, MD

## 2020-06-24 ENCOUNTER — Other Ambulatory Visit: Payer: Self-pay | Admitting: Family Medicine

## 2020-07-17 ENCOUNTER — Other Ambulatory Visit: Payer: Self-pay

## 2020-07-17 DIAGNOSIS — M5136 Other intervertebral disc degeneration, lumbar region: Secondary | ICD-10-CM

## 2020-07-17 MED ORDER — HYDROCODONE-ACETAMINOPHEN 7.5-325 MG PO TABS: 1.0000 | ORAL_TABLET | Freq: Four times a day (QID) | ORAL | 0 refills | Status: DC | PRN

## 2020-07-17 NOTE — Telephone Encounter (Signed)
Please review. Thanks!  

## 2020-07-17 NOTE — Telephone Encounter (Signed)
Copied from CRM [id number]. Topic: General - Inquiry >> Jul 16, 2020  5:15 PM Daphine Deutscher D wrote: Reason for CRM: Pt is about to run out of her hydrocodone 7.5 and want to know if Dr. Sherrie Mustache will refill this for her.  She will be out on the 28th, jan.  CB#  [phone]  [facility] [place]

## 2020-07-20 ENCOUNTER — Other Ambulatory Visit: Payer: Self-pay | Admitting: Family Medicine

## 2020-07-20 DIAGNOSIS — E039 Hypothyroidism, unspecified: Secondary | ICD-10-CM

## 2020-07-20 NOTE — Telephone Encounter (Signed)
Courtesy refill - Pt needs labs and OV.

## 2020-07-21 NOTE — Progress Notes (Signed)
Subjective:   Morgan Lynch is a 75 y.o. female who presents for Medicare Annual (Subsequent) preventive examination.  I connected with Morgan Lynch today by telephone and verified that I am speaking with the correct person using two identifiers. Location patient: home Location provider: work Persons participating in the virtual visit: patient, provider.   I discussed the limitations, risks, security and privacy concerns of performing an evaluation and management service by telephone and the availability of in person appointments. I also discussed with the patient that there may be a patient responsible charge related to this service. The patient expressed understanding and verbally consented to this telephonic visit.    Interactive audio and video telecommunications were attempted between this provider and patient, however failed, due to patient having technical difficulties OR patient did not have access to video capability.  We continued and completed visit with audio only.   Review of Systems    N/A  Cardiac Risk Factors include: advanced age (>13mn, >>34women);obesity (BMI >30kg/m2)     Objective:    Today's Vitals   07/22/20 1107  PainSc: 8    There is no height or weight on file to calculate BMI.  Advanced Directives 07/22/2020 07/17/2019 05/24/2018 05/23/2017 01/31/2017  Does Patient Have a Medical Advance Directive? _0   Would patient like information on creating a medical advance directive? No - Patient declined No - Patient declined No - Patient declined No - Patient declined No - Patient declined    Current Medications (verified) Outpatient Encounter Medications as of 07/22/2020  Medication Sig  . albuterol (VENTOLIN HFA) 108 (90 Base) MCG/ACT inhaler INHALE 2 PUFFS BY MOUTH EVERY 6 HOURS AS NEEDED  . Armodafinil 250 MG tablet TAKE 1 TABLET(250 MG) BY MOUTH DAILY  . aspirin 81 MG tablet Take 81 mg by mouth daily.   . Biotin 1 MG CAPS Take by mouth daily.    . Cholecalciferol 125 MCG (5000 UT) capsule Take by mouth as directed. Takes 10 tablets once a week.  . diphenhydrAMINE (BENADRYL) 25 MG tablet Take by mouth every 6 (six) hours as needed.   .Marland KitchenEPINEPHRINE 0.3 mg/0.3 mL IJ SOAJ injection USE AS DIRECTED  . Flaxseed, Linseed, (FLAX SEED OIL) 1000 MG CAPS Take 1,000 mg by mouth daily.  .Marland KitchenFLUoxetine (PROZAC) 20 MG capsule TAKE 3 CAPSULES(60 MG) BY MOUTH DAILY  . fluticasone (FLONASE) 50 MCG/ACT nasal spray SHAKE LIQUID AND USE 2 SPRAYS IN EACH NOSTRIL EVERY NIGHT AT BEDTIME  . HYDROcodone-acetaminophen (NORCO) 7.5-325 MG tablet Take 1 tablet by mouth every 6 (six) hours as needed for moderate pain.  .Marland Kitchenipratropium (ATROVENT) 0.06 % nasal spray USE 2 SPRAYS IN EACH NOSTRIL THREE TIMES DAILY  . levothyroxine (SYNTHROID) 125 MCG tablet TAKE 1 TABLET(125 MCG) BY MOUTH DAILY  . Magnesium 500 MG CAPS Take by mouth 2 (two) times daily.   . Melatonin 10 MG CAPS Take 10 mg by mouth daily at 6 (six) AM.  . montelukast (SINGULAIR) 10 MG tablet Take 1 tablet (10 mg total) by mouth daily.  . Multiple Vitamins-Minerals (CENTRUM SILVER 50+MEN) TABS Take by mouth daily.   . naproxen (NAPROSYN) 500 MG tablet TAKE 1 TABLET(500 MG) BY MOUTH TWICE DAILY AS NEEDED  . omeprazole (PRILOSEC) 20 MG capsule TAKE 1 CAPSULE(20 MG) BY MOUTH DAILY  . pyridoxine (B-6) 100 MG tablet Take by mouth daily. Unsure dose - takes occasionally  . tiZANidine (ZANAFLEX) 4 MG tablet TAKE 1 TABLET BY MOUTH EVERY  8 HOURS AS NEEDED  . triamcinolone cream (KENALOG) 0.5 % APPLY EXTERNALLY TO THE AFFECTED AREA DAILY AS NEEDED  . vitamin E (VITAMIN E) 180 MG (400 UNITS) capsule Take 400 Units by mouth daily.  Marland Kitchen zinc gluconate 50 MG tablet Take 100 mg by mouth daily.  Marland Kitchen atenolol-chlorthalidone (TENORETIC) 50-25 MG tablet TAKE 1/2 TABLET BY MOUTH DAILY (Patient not taking: Reported on 07/22/2020)  . colestipol (COLESTID) 1 g tablet Take 1 tablet (1 g total) by mouth 2 (two) times daily. (Patient not  taking: Reported on 07/22/2020)  . PRESCRIPTION MEDICATION CPAP every night (Patient not taking: Reported on 07/22/2020)  . topiramate (TOPAMAX) 100 MG tablet TAKE 1 TABLET BY MOUTH TWICE DAILY (Patient not taking: Reported on 07/22/2020)  . Vitamin D, Ergocalciferol, (DRISDOL) 50000 UNITS CAPS capsule Take 50,000 Units by mouth every 7 (seven) days.  (Patient not taking: Reported on 07/22/2020)   No facility-administered encounter medications on file as of 07/22/2020.    Allergies (verified) Repatha [evolocumab], Colestid [colestipol hcl], Oysters  [shellfish allergy], Rosuvastatin, and Simvastatin   History: Past Medical History:  Diagnosis Date  . Anxiety   . Arthritis   . Cerebral artery occlusion with cerebral infarction ([place])   . GERD (gastroesophageal reflux disease)   . Herniated disc, cervical   . History of chicken pox   . History of measles   . History of mumps   . Hyperlipidemia   . Hypertension   . OSA (obstructive sleep apnea)   . Osteopenia   . Thyroid disease    Past Surgical History:  Procedure Laterality Date  . ABDOMINAL HYSTERECTOMY     unilateral OOP  . AUGMENTATION MAMMAPLASTY  1975 &1992   replacedl of implants in 1992  . CAROTID DOPPLER ULTRASOUND  01/04/2013   No hemodynamically significant stenosis  . CHOLECYSTECTOMY    . DOPPLER ECHOCARDIOGRAPHY  04/04/2013   mild MVR and TR. mild LVH  . MRI BRAIN,BRAIN STEM  01/04/2013   Chronic Ischemic changes. Old lacunar infarct. Severe sinusitis  . MYOCARDIAL PERFUSION SCAN  04/05/2013   Normal  . pulmonary function test  07/21/2010   Normal; Done by Dr. Humphrey Rolls  . SPINAL FUSION  1988   lower back  . TOTAL HIP ARTHROPLASTY Left 2006  . VULVAR LESION REMOVAL N/A 02/01/2017   Procedure: INCISION AND DRAINAGE VULVAR ABSCESS;  Surgeon: Ouida Sills, Gwen Her, MD;  Location: [facility] ORS;  Service: Gynecology;  Laterality: N/A;   Family History  Problem Relation Age of Onset  . Cancer Mother        female cancer (?)  .  Pancreatic cancer Father   . Colon polyps Father   . Breast cancer Maternal Aunt   . Multiple sclerosis Daughter   . Kidney failure Son   . Hypertension Son    Social History   Socioeconomic History  . Marital status: Divorced    Spouse name: Not on file  . Number of children: 5  . Years of education: Not on file  . Highest education level: Some college, no degree  Occupational History  . Occupation: Disabled  Tobacco Use  . Smoking status: Former Smoker    Packs/day: 0.50    Years: 8.00    Pack years: 4.00    Types: Cigarettes  . Smokeless tobacco: Never Used  . Tobacco comment: quit smoking in 1980s  Vaping Use  . Vaping Use: Every day  Substance and Sexual Activity  . Alcohol use: No    Alcohol/week: 0.0 standard  drinks  . Drug use: Yes    Types: Marijuana    Comment: uses marijuana  . Sexual activity: Not on file  Other Topics Concern  . Not on file  Social History Narrative   1 miscarriage, married 3 times and now divorced   Social Determinants of Health   Financial Resource Strain: Medium Risk  . Difficulty of Paying Living Expenses: Somewhat hard  Food Insecurity: No Food Insecurity  . Worried About Charity fundraiser in the Last Year: Never true  . Ran Out of Food in the Last Year: Never true  Transportation Needs: No Transportation Needs  . Lack of Transportation (Medical): No  . Lack of Transportation (Non-Medical): No  Physical Activity: Inactive  . Days of Exercise per Week: 0 days  . Minutes of Exercise per Session: 0 min  Stress: Stress Concern Present  . Feeling of Stress : Rather much  Social Connections: Socially Isolated  . Frequency of Communication with Friends and Family: More than three times a week  . Frequency of Social Gatherings with Friends and Family: Once a week  . Attends Religious Services: Never  . Active Member of Clubs or Organizations: No  . Attends Archivist Meetings: Never  . Marital Status: Divorced     Tobacco Counseling Counseling given: Not Answered Comment: quit smoking in 1980s   Clinical Intake:  Pre-visit preparation completed: Yes  Pain : 0-10 Pain Score: 8  Pain Type: Chronic pain Pain Location:  (all over) Pain Descriptors / Indicators: Aching Pain Frequency: Constant Pain Relieving Factors: Taking Hydrocodone and Aleve as needed for pain.  Pain Relieving Factors: Taking Hydrocodone and Aleve as needed for pain.  Nutritional Risks: Nausea/ vomitting/ diarrhea (Diarrhea intermittenly due to IBS.) Diabetes: No  How often do you need to have someone help you when you read instructions, pamphlets, or other written materials from your doctor or pharmacy?: 1 - Never  Diabetic? No  Interpreter Needed?: No  Information entered by :: [facility], LPN   Activities of Daily Living In your present state of health, do you have any difficulty performing the following activities: 07/22/2020  Hearing? N  Vision? Y  Comment Has a cataract on the right eye.  Difficulty concentrating or making decisions? N  Walking or climbing stairs? Y  Comment Due to SOB.  Dressing or bathing? N  Doing errands, shopping? N  Preparing Food and eating ? N  Using the Toilet? N  In the past six months, have you accidently leaked urine? Y  Comment Occasionally, wears depends daily.  Do you have problems with loss of bowel control? N  Managing your Medications? N  Managing your Finances? N  Housekeeping or managing your Housekeeping? N  Some recent data might be hidden    Patient Care Team: Birdie Sons, MD as PCP - General (Family Medicine) Thornton Park, MD as Referring Physician (Orthopedic Surgery) [place], [place] (Optometry)  Indicate any recent Medical Services you may have received from other than [facility] providers in the past year (date may be approximate).     Assessment:   This is a routine wellness examination for [place].  Hearing/Vision screen No exam data  present  Dietary issues and exercise activities discussed: Current Exercise Habits: The patient does not participate in regular exercise at present, Exercise limited by: orthopedic condition(s)  Goals    . Cut out extra servings     Recommend to continue current diet regimen of cutting out sweets and junk  food.     . Prevent falls     Recommend to remove any items from the home that may cause slips or trips.      Depression Screen PHQ 2/9 Scores 07/22/2020 07/17/2019 07/17/2019 05/24/2018 05/15/2018 05/23/2017 02/24/2016  PHQ - 2 Score 0 0 0 0 0 0 0  PHQ- 9 Score - - - - - 4 -    Fall Risk Fall Risk  07/22/2020 07/17/2019 06/24/2019 06/24/2019 05/24/2018  Falls in the past year? 1 1 0 0 1  Number falls in past yr: 0 0 0 0 1  Injury with Fall? 0 0 0 0 0  Follow up Falls prevention discussed Falls prevention discussed Falls evaluation completed Falls evaluation completed Falls prevention discussed    FALL RISK PREVENTION PERTAINING TO THE HOME:  Any stairs in or around the home? Yes  If so, are there any without handrails? No  Home free of loose throw rugs in walkways, pet beds, electrical cords, etc? Yes  Adequate lighting in your home to reduce risk of falls? Yes   ASSISTIVE DEVICES UTILIZED TO PREVENT FALLS:  Life alert? No  Use of a cane, walker or w/c? Yes  Grab bars in the bathroom? No  Shower chair or bench in shower? Yes  Elevated toilet seat or a handicapped toilet? Yes    Cognitive Function: Normal cognitive status assessed by observation by this Nurse Health Advisor. No abnormalities found.       6CIT Screen 07/17/2019 05/23/2017  What Year? 4 points 0 points  What month? 0 points 0 points  What time? 0 points 0 points  Count back from 20 0 points 0 points  Months in reverse 0 points 0 points  Repeat phrase 0 points 4 points  Total Score 4 4    Immunizations Immunization History  Administered Date(s) Administered  . Pneumococcal Conjugate-13 05/24/2018  .  Pneumococcal Polysaccharide-23 03/23/2012  . Tdap 03/23/2012  . Zoster 03/23/2012    TDAP status: Up to date  Flu Vaccine status: Declined, Education has been provided regarding the importance of this vaccine but patient still declined. Advised may receive this vaccine at local pharmacy or Health Dept. Aware to provide a copy of the vaccination record if obtained from local pharmacy or Health Dept. Verbalized acceptance and understanding.  Pneumococcal vaccine status: Up to date  Covid-19 vaccine status: Declined, Education has been provided regarding the importance of this vaccine but patient still declined. Advised may receive this vaccine at local pharmacy or Health Dept.or vaccine clinic. Aware to provide a copy of the vaccination record if obtained from local pharmacy or Health Dept. Verbalized acceptance and understanding.  Qualifies for Shingles Vaccine? Yes   Zostavax completed Yes   Shingrix Completed?: No.    Education has been provided regarding the importance of this vaccine. Patient has been advised to call insurance company to determine out of pocket expense if they have not yet received this vaccine. Advised may also receive vaccine at local pharmacy or Health Dept. Verbalized acceptance and understanding.  Screening Tests Health Maintenance  Topic Date Due  . COLONOSCOPY (Pts 45-36yr Insurance coverage will need to be confirmed)  05/23/2016  . COVID-19 Vaccine (1) 08/07/2020 (Originally 05/29/1951)  . INFLUENZA VACCINE  09/17/2020 (Originally 01/19/2020)  . DEXA SCAN  07/22/2021 (Originally 05/23/2019)  . Hepatitis C Screening  07/22/2021 (Originally [id number]  . TETANUS/TDAP  03/23/2022  . PNA vac Low Risk Adult  Completed    Health Maintenance  Health Maintenance Due  Topic Date Due  . COLONOSCOPY (Pts 45-24yr Insurance coverage will need to be confirmed)  05/23/2016    Colorectal cancer screening: Currently due, declined a colonoscopy referral at this time. Did  agree to complete the cologuard kit. Order placed.   Mammogram status: No longer required due to age.  Bone Density status: Currently due. Declined order at this time.   Lung Cancer Screening: (Low Dose CT Chest recommended if Age 76-80years, 30 pack-year currently smoking OR have quit w/in 15years.) does not qualify.   Additional Screening:  Hepatitis C Screening: does qualify however declines receiving.   Vision Screening: Recommended annual ophthalmology exams for early detection of glaucoma and other disorders of the eye. Is the patient up to date with their annual eye exam?  Yes  Who is the provider or what is the name of the office in which the patient attends annual eye exams? [place] pt is not established with a provider, would they like to be referred to a provider to establish care? No .   Dental Screening: Recommended annual dental exams for proper oral hygiene  Community Resource Referral / Chronic Care Management: CRR required this visit?  No   CCM required this visit?  No      Plan:     I have personally reviewed and noted the following in the patient's chart:   . Medical and social history . Use of alcohol, tobacco or illicit drugs  . Current medications and supplements . Functional ability and status . Nutritional status . Physical activity . Advanced directives . List of other physicians . Hospitalizations, surgeries, and ER visits in previous 12 months . Vitals . Screenings to include cognitive, depression, and falls . Referrals and appointments  In addition, I have reviewed and discussed with patient certain preventive protocols, quality metrics, and best practice recommendations. A written personalized care plan for preventive services as well as general preventive health recommendations were provided to patient.      [place]  25/01/6824  Nurse Notes: Pt declined a DEXA scan order, future Hep C lab order, flu vaccine and Covid  vaccine at this time.

## 2020-07-22 ENCOUNTER — Ambulatory Visit (INDEPENDENT_AMBULATORY_CARE_PROVIDER_SITE_OTHER): Payer: 59

## 2020-07-22 ENCOUNTER — Other Ambulatory Visit: Payer: Self-pay

## 2020-07-22 DIAGNOSIS — Z1211 Encounter for screening for malignant neoplasm of colon: Secondary | ICD-10-CM

## 2020-07-22 DIAGNOSIS — Z0000 Encounter for general adult medical examination without abnormal findings: Secondary | ICD-10-CM | POA: Diagnosis not present

## 2020-07-22 NOTE — Patient Instructions (Signed)
Morgan Lynch , Thank you for taking time to come for your Medicare Wellness Visit. I appreciate your ongoing commitment to your health goals. Please review the following plan we discussed and let me know if I can assist you in the future.   Screening recommendations/referrals: Colonoscopy: Currently due. Cologuard ordered today.  Mammogram: No longer required.  Bone Density: Currently due, declined order at this time. Recommended yearly ophthalmology/optometry visit for glaucoma screening and checkup Recommended yearly dental visit for hygiene and checkup  Vaccinations: Influenza vaccine: Currently due, declined receiving.  Pneumococcal vaccine: Completed series Tdap vaccine: Up to date, due 03/2022 Shingles vaccine: Shingrix discussed. Please contact your pharmacy for coverage information.     Advanced directives: Advance directive discussed with you today. Even though you declined this today please call our office should you change your mind and we can give you the proper paperwork for you to fill out.  Conditions/risks identified: Fall risk preventatives discussed today. Recommend to continue current diet regimen of cutting out sweets and junk food.   Next appointment: 07/27/20 @ 2:40 PM for an AWV call. Declined scheduling a follow up with PCP at this time.    Preventive Care 76 Years and Older, Female Preventive care refers to lifestyle choices and visits with your health care provider that can promote health and wellness. What does preventive care include?  A yearly physical exam. This is also called an annual well check.  Dental exams once or twice a year.  Routine eye exams. Ask your health care provider how often you should have your eyes checked.  Personal lifestyle choices, including:  Daily care of your teeth and gums.  Regular physical activity.  Eating a healthy diet.  Avoiding tobacco and drug use.  Limiting alcohol use.  Practicing safe sex.  Taking low-dose  aspirin every day.  Taking vitamin and mineral supplements as recommended by your health care provider. What happens during an annual well check? The services and screenings done by your health care provider during your annual well check will depend on your age, overall health, lifestyle risk factors, and family history of disease. Counseling  Your health care provider may ask you questions about your:  Alcohol use.  Tobacco use.  Drug use.  Emotional well-being.  Home and relationship well-being.  Sexual activity.  Eating habits.  History of falls.  Memory and ability to understand (cognition).  Work and work Astronomer.  Reproductive health. Screening  You may have the following tests or measurements:  Height, weight, and BMI.  Blood pressure.  Lipid and cholesterol levels. These may be checked every 5 years, or more frequently if you are over 32 years old.  Skin check.  Lung cancer screening. You may have this screening every year starting at age 76 if you have a 30-pack-year history of smoking and currently smoke or have quit within the past 15 years.  Fecal occult blood test (FOBT) of the stool. You may have this test every year starting at age 76.  Flexible sigmoidoscopy or colonoscopy. You may have a sigmoidoscopy every 5 years or a colonoscopy every 10 years starting at age 76.  Hepatitis C blood test.  Hepatitis B blood test.  Sexually transmitted disease (STD) testing.  Diabetes screening. This is done by checking your blood sugar (glucose) after you have not eaten for a while (fasting). You may have this done every 1-3 years.  Bone density scan. This is done to screen for osteoporosis. You may have this done starting  at age 76.  Mammogram. This may be done every 1-2 years. Talk to your health care provider about how often you should have regular mammograms. Talk with your health care provider about your test results, treatment options, and if  necessary, the need for more tests. Vaccines  Your health care provider may recommend certain vaccines, such as:  Influenza vaccine. This is recommended every year.  Tetanus, diphtheria, and acellular pertussis (Tdap, Td) vaccine. You may need a Td booster every 10 years.  Zoster vaccine. You may need this after age 76.  Pneumococcal 13-valent conjugate (PCV13) vaccine. One dose is recommended after age 76.  Pneumococcal polysaccharide (PPSV23) vaccine. One dose is recommended after age 76. Talk to your health care provider about which screenings and vaccines you need and how often you need them. This information is not intended to replace advice given to you by your health care provider. Make sure you discuss any questions you have with your health care provider. Document Released: 07/03/2015 Document Revised: 02/24/2016 Document Reviewed: 04/07/2015 Elsevier Interactive Patient Education  2017 [employer].  Fall Prevention in the Home Falls can cause injuries. They can happen to people of all ages. There are many things you can do to make your home safe and to help prevent falls. What can I do on the outside of my home?  Regularly fix the edges of walkways and driveways and fix any cracks.  Remove anything that might make you trip as you walk through a door, such as a raised step or threshold.  Trim any bushes or trees on the path to your home.  Use bright outdoor lighting.  Clear any walking paths of anything that might make someone trip, such as rocks or tools.  Regularly check to see if handrails are loose or broken. Make sure that both sides of any steps have handrails.  Any raised decks and porches should have guardrails on the edges.  Have any leaves, snow, or ice cleared regularly.  Use sand or salt on walking paths during winter.  Clean up any spills in your garage right away. This includes oil or grease spills. What can I do in the bathroom?  Use night  lights.  Install grab bars by the toilet and in the tub and shower. Do not use towel bars as grab bars.  Use non-skid mats or decals in the tub or shower.  If you need to sit down in the shower, use a plastic, non-slip stool.  Keep the floor dry. Clean up any water that spills on the floor as soon as it happens.  Remove soap buildup in the tub or shower regularly.  Attach bath mats securely with double-sided non-slip rug tape.  Do not have throw rugs and other things on the floor that can make you trip. What can I do in the bedroom?  Use night lights.  Make sure that you have a light by your bed that is easy to reach.  Do not use any sheets or blankets that are too big for your bed. They should not hang down onto the floor.  Have a firm chair that has side arms. You can use this for support while you get dressed.  Do not have throw rugs and other things on the floor that can make you trip. What can I do in the kitchen?  Clean up any spills right away.  Avoid walking on wet floors.  Keep items that you use a lot in easy-to-reach places.  If  you need to reach something above you, use a strong step stool that has a grab bar.  Keep electrical cords out of the way.  Do not use floor polish or wax that makes floors slippery. If you must use wax, use non-skid floor wax.  Do not have throw rugs and other things on the floor that can make you trip. What can I do with my stairs?  Do not leave any items on the stairs.  Make sure that there are handrails on both sides of the stairs and use them. Fix handrails that are broken or loose. Make sure that handrails are as long as the stairways.  Check any carpeting to make sure that it is firmly attached to the stairs. Fix any carpet that is loose or worn.  Avoid having throw rugs at the top or bottom of the stairs. If you do have throw rugs, attach them to the floor with carpet tape.  Make sure that you have a light switch at the  top of the stairs and the bottom of the stairs. If you do not have them, ask someone to add them for you. What else can I do to help prevent falls?  Wear shoes that:  Do not have high heels.  Have rubber bottoms.  Are comfortable and fit you well.  Are closed at the toe. Do not wear sandals.  If you use a stepladder:  Make sure that it is fully opened. Do not climb a closed stepladder.  Make sure that both sides of the stepladder are locked into place.  Ask someone to hold it for you, if possible.  Clearly mark and make sure that you can see:  Any grab bars or handrails.  First and last steps.  Where the edge of each step is.  Use tools that help you move around (mobility aids) if they are needed. These include:  Canes.  Walkers.  Scooters.  Crutches.  Turn on the lights when you go into a dark area. Replace any light bulbs as soon as they burn out.  Set up your furniture so you have a clear path. Avoid moving your furniture around.  If any of your floors are uneven, fix them.  If there are any pets around you, be aware of where they are.  Review your medicines with your doctor. Some medicines can make you feel dizzy. This can increase your chance of falling. Ask your doctor what other things that you can do to help prevent falls. This information is not intended to replace advice given to you by your health care provider. Make sure you discuss any questions you have with your health care provider. Document Released: 04/02/2009 Document Revised: 11/12/2015 Document Reviewed: 07/11/2014 Elsevier Interactive Patient Education  2017 [employer].

## 2020-07-28 ENCOUNTER — Other Ambulatory Visit: Payer: Self-pay | Admitting: Family Medicine

## 2020-07-30 LAB — COLOGUARD: Cologuard: NEGATIVE

## 2020-08-06 LAB — COLOGUARD: Cologuard: NEGATIVE

## 2020-08-11 ENCOUNTER — Other Ambulatory Visit: Payer: Self-pay | Admitting: Family Medicine

## 2020-08-17 ENCOUNTER — Other Ambulatory Visit: Payer: Self-pay | Admitting: Family Medicine

## 2020-08-17 DIAGNOSIS — M5136 Other intervertebral disc degeneration, lumbar region: Secondary | ICD-10-CM

## 2020-08-17 MED ORDER — HYDROCODONE-ACETAMINOPHEN 7.5-325 MG PO TABS: 1.0000 | ORAL_TABLET | Freq: Four times a day (QID) | ORAL | 0 refills | Status: DC | PRN

## 2020-08-17 NOTE — Telephone Encounter (Signed)
Requested medication (s) are due for refill today:   Provider to review  Requested medication (s) are on the active medication list:   Yes  Future visit scheduled:   No   Last ordered: 07/17/2020 #120, 0 refills  Clinic note:  Returned because it's a non delegated refill   Requested Prescriptions  Pending Prescriptions Disp Refills   HYDROcodone-acetaminophen (NORCO) 7.5-325 MG tablet 120 tablet 0    Sig: Take 1 tablet by mouth every 6 (six) hours as needed for moderate pain.      Not Delegated - Analgesics:  Opioid Agonist Combinations Failed - 08/17/2020  1:15 PM      Failed - This refill cannot be delegated      Failed - Urine Drug Screen completed in last 360 days      Failed - Valid encounter within last 6 months    Recent Outpatient Visits           10 months ago Familial hypercholesteremia   [facility] Malva Limes, MD   1 year ago Coronary artery disease involving native coronary artery of native heart without angina pectoris   [facility] Malva Limes, MD   2 years ago Annual physical exam   [facility] Malva Limes, MD   2 years ago Burning with urination   [facility] Chrismon, Jodell Cipro, PA-C   3 years ago Annual physical exam   [facility] Malva Limes, MD

## 2020-08-17 NOTE — Telephone Encounter (Signed)
Copied from CRM [id number]. Topic: Quick Communication - Rx Refill/Question >> Aug 17, 2020 12:33 PM Gaetana Michaelis A wrote: Medication: HYDROcodone-acetaminophen (NORCO) 7.5-325 MG tablet   Has the patient contacted their pharmacy? Yes, patient has reached out to pharmacy previously and been directed to contact PCP for all refills of this medication due to it being a controlled substance  Preferred Pharmacy (with phone number or street name): [facility] #[id number] - [place], [place] - [address] [place] [address] AT [facility] OF [address]  Phone:  [phone]   Agent: Please be advised that RX refills may take up to 3 business days. We ask that you follow-up with your pharmacy.

## 2020-08-20 ENCOUNTER — Other Ambulatory Visit: Payer: Self-pay | Admitting: Family Medicine

## 2020-09-14 ENCOUNTER — Other Ambulatory Visit: Payer: Self-pay | Admitting: Family Medicine

## 2020-09-14 DIAGNOSIS — M5136 Other intervertebral disc degeneration, lumbar region: Secondary | ICD-10-CM

## 2020-09-14 NOTE — Telephone Encounter (Signed)
Medication Refill - Medication:   HYDROcodone-acetaminophen (NORCO) 7.5-325 MG tablet    Has the patient contacted their pharmacy? Yes.  needs doctor approval.  (Agent: If no, request that the patient contact the pharmacy for the refill.) (Agent: If yes, when and what did the pharmacy advise?)  Preferred Pharmacy (with phone number or street name):   [facility] #[id number] [facility] [place] - [address] [place] [address] AT [facility] OF [address]  [address] [address] [place] [address]  Phone: [phone] Fax: [phone]    Agent: Please be advised that RX refills may take up to 3 business days. We ask that you follow-up with your pharmacy.

## 2020-09-14 NOTE — Telephone Encounter (Signed)
Requested medication (s) are due for refill today: yes  Requested medication (s) are on the active medication list: yes  Last refill:  08/17/20 #120 0 refills  Future visit scheduled: no  Notes to clinic:  not delegated per protocol, last seen 11 months ago      Requested Prescriptions  Pending Prescriptions Disp Refills   HYDROcodone-acetaminophen (NORCO) 7.5-325 MG tablet 120 tablet 0    Sig: Take 1 tablet by mouth every 6 (six) hours as needed for moderate pain.      Not Delegated - Analgesics:  Opioid Agonist Combinations Failed - 09/14/2020  5:24 PM      Failed - This refill cannot be delegated      Failed - Urine Drug Screen completed in last 360 days      Failed - Valid encounter within last 6 months    Recent Outpatient Visits           11 months ago Familial hypercholesteremia   [facility] Malva Limes, MD   1 year ago Coronary artery disease involving native coronary artery of native heart without angina pectoris   [facility] Malva Limes, MD   2 years ago Annual physical exam   [facility] Malva Limes, MD   2 years ago Burning with urination   [facility] Chrismon, Jodell Cipro, PA-C   3 years ago Annual physical exam   [facility] Malva Limes, MD

## 2020-09-15 MED ORDER — HYDROCODONE-ACETAMINOPHEN 7.5-325 MG PO TABS: 1.0000 | ORAL_TABLET | Freq: Four times a day (QID) | ORAL | 0 refills | Status: DC | PRN

## 2020-09-17 DIAGNOSIS — H2513 Age-related nuclear cataract, bilateral: Secondary | ICD-10-CM | POA: Insufficient documentation

## 2020-09-18 HISTORY — PX: CATARACT EXTRACTION: SUR2

## 2020-09-22 ENCOUNTER — Other Ambulatory Visit: Payer: Self-pay | Admitting: Family Medicine

## 2020-09-22 ENCOUNTER — Telehealth: Payer: Self-pay

## 2020-09-22 NOTE — Telephone Encounter (Signed)
Copied from CRM [id number]. Topic: General - Other >> Sep 22, 2020  1:22 PM Mcneil, Ja-Kwan wrote: Reason for CRM: Pt stated the Rx for Armodafinil 250 MG tablet needs prior authorization.

## 2020-09-22 NOTE — Telephone Encounter (Signed)
Requested medication (s) are due for refill today: yes   Requested medication (s) are on the active medication list:  yes   Last refill:  08/19/2020  Future visit scheduled:no  Notes to clinic:  this refill cannot be delegated    Requested Prescriptions  Pending Prescriptions Disp Refills   Armodafinil 250 MG tablet [Pharmacy Med Name: ARMODAFINIL 250MG  TABLETS] 30 tablet     Sig: TAKE 1 TABLET(250 MG) BY MOUTH DAILY      Not Delegated - Psychiatry:  Stimulants/ADHD Failed - 09/22/2020 10:37 AM      Failed - This refill cannot be delegated      Failed - Urine Drug Screen completed in last 360 days      Failed - Valid encounter within last 3 months    Recent Outpatient Visits           11 months ago Familial hypercholesteremia   [facility], MD   1 year ago Coronary artery disease involving native coronary artery of native heart without angina pectoris   [facility], MD   2 years ago Annual physical exam   [facility], MD   2 years ago Burning with urination   [facility] Chrismon, [facility], PA-C   3 years ago Annual physical exam   [facility], MD

## 2020-09-30 ENCOUNTER — Other Ambulatory Visit: Payer: Self-pay | Admitting: Family Medicine

## 2020-09-30 DIAGNOSIS — E7801 Familial hypercholesterolemia: Secondary | ICD-10-CM

## 2020-09-30 NOTE — Telephone Encounter (Signed)
Pt called stating that she is still needing to have PA for this medication. She states that if this cannot be done to please have something else sent in for her. She states that all she has been able to do is sleep. Please advise .

## 2020-09-30 NOTE — Telephone Encounter (Signed)
Morgan Lynch called from the pharmacy to report that the patient is completely out of her current supply, and insurance is still waiting on PA. Please advise

## 2020-09-30 NOTE — Telephone Encounter (Signed)
Notes to clinic: medications that have been requested have expired or been d/c  Review for continued use and refill    Requested Prescriptions  Pending Prescriptions Disp Refills   albuterol (VENTOLIN HFA) 108 (90 Base) MCG/ACT inhaler [Pharmacy Med Name: ALBUTEROL HFA INH (200 PUFFS) 6.7GM] 8.5 g 6    Sig: INHALE 2 PUFFS BY MOUTH EVERY 6 HOURS AS NEEDED      Pulmonology:  Beta Agonists Failed - 09/30/2020 12:04 PM      Failed - One inhaler should last at least one month. If the patient is requesting refills earlier, contact the patient to check for uncontrolled symptoms.      Failed - Valid encounter within last 12 months    Recent Outpatient Visits           12 months ago Familial hypercholesteremia   [facility] Fisher, Demetrios Isaacs, MD   1 year ago Coronary artery disease involving native coronary artery of native heart without angina pectoris   [facility] Malva Limes, MD   2 years ago Annual physical exam   [facility] Malva Limes, MD   2 years ago Burning with urination   [facility] Chrismon, Jodell Cipro, PA-C   3 years ago Annual physical exam   [facility] Malva Limes, MD                  ezetimibe (ZETIA) 10 MG tablet [Pharmacy Med Name: EZETIMIBE 10MG  TABLETS] 90 tablet 3    Sig: TAKE 1 TABLET(10 MG) BY MOUTH DAILY      Cardiovascular:  Antilipid - Sterol Transport Inhibitors Failed - 09/30/2020 12:04 PM      Failed - Total Cholesterol in normal range and within 360 days    Cholesterol, Total  Date Value Ref Range Status  04/23/2019 282 (H) 100 - 199 mg/dL Final          Failed - LDL in normal range and within 360 days    LDL Cholesterol (Calc)  Date Value Ref Range Status  05/23/2017 210 (H) mg/dL (calc) Final    Comment:    LDL-C levels > or = 190 mg/dL may indicate familial  hypercholesterolemia (FH). Clinical assessment and  measurement of blood lipid levels should  be  considered for all first degree relatives of  patients with an FH diagnosis.  For questions about testing for familial hypercholesterolemia, please call 14/09/2016 at Engineer, [email or web address]. [employer], et al. J National Lipid Association  Recommendations for Patient-Centered Management of  Dyslipidemia: Part 1 Journal of Clinical Lipidology  2015;9(2), 129-169. Reference range: <100 . Desirable range <100 mg/dL for primary prevention;   <70 mg/dL for patients with CHD or diabetic patients  with > or = 2 CHD risk factors. 10-05-2006 LDL-C is now calculated using the Martin-Hopkins  calculation, which is a validated novel method providing  better accuracy than the Friedewald equation in the  estimation of LDL-C.  Marland Kitchen et al. Horald Pollen. Lenox Ahr): 2061-2068  (http://education.QuestDiagnostics.com/faq/FAQ164)    LDL Chol Calc (NIH)  Date Value Ref Range Status  04/23/2019 191 (H) 0 - 99 mg/dL Final          Failed - HDL in normal range and within 360 days    HDL  Date Value Ref Range Status  04/23/2019 48 >39 mg/dL Final          Failed - Triglycerides in normal range and within 360 days  Triglycerides  Date Value Ref Range Status  04/23/2019 225 (H) 0 - 149 mg/dL Final          Failed - Valid encounter within last 12 months    Recent Outpatient Visits           12 months ago Familial hypercholesteremia   [facility] Malva Limes, MD   1 year ago Coronary artery disease involving native coronary artery of native heart without angina pectoris   [facility] Malva Limes, MD   2 years ago Annual physical exam   [facility]imes, MD   2 years ago Burning with urination   [facility] Chrismon, Jodell Cipro, PA-C   3 years ago Annual physical exam   [facility] Malva Limes, MD                  topiramate (TOPAMAX) 100 MG tablet [Pharmacy Med Name:  TOPIRAMATE 100MG  TABLETS] 180 tablet 4    Sig: TAKE 1 TABLET BY MOUTH TWICE DAILY      Not Delegated - Neurology: Anticonvulsants - topiramate & zonisamide Failed - 09/30/2020 12:04 PM      Failed - This refill cannot be delegated      Failed - Cr in normal range and within 360 days    Creat  Date Value Ref Range Status  05/23/2017 0.99 (H) 0.60 - 0.93 mg/dL Final    Comment:    For patients >79 years of age, the reference limit for Creatinine is approximately 13% higher for people identified as African-American. .    Creatinine, Ser  Date Value Ref Range Status  04/23/2019 1.16 (H) 0.57 - 1.00 mg/dL Final          Failed - CO2 in normal range and within 360 days    CO2  Date Value Ref Range Status  04/23/2019 19 (L) 20 - 29 mmol/L Final          Failed - Valid encounter within last 12 months    Recent Outpatient Visits           12 months ago Familial hypercholesteremia   [facility], MD   1 year ago Coronary artery disease involving native coronary artery of native heart without angina pectoris   [facility], MD   2 years ago Annual physical exam   [facility], MD   2 years ago Burning with urination   [facility] Chrismon, [facility], PA-C   3 years ago Annual physical exam   [facility], MD

## 2020-10-07 NOTE — Telephone Encounter (Signed)
PA was completed by [employer]. Please follow up on this message and check on status of PA.

## 2020-10-08 NOTE — Telephone Encounter (Signed)
PA has been approved according to [email or web address]

## 2020-10-12 ENCOUNTER — Other Ambulatory Visit: Payer: Self-pay | Admitting: Family Medicine

## 2020-10-12 DIAGNOSIS — M5136 Other intervertebral disc degeneration, lumbar region: Secondary | ICD-10-CM

## 2020-10-12 NOTE — Telephone Encounter (Signed)
Copied from CRM [id number]. Topic: Quick Communication - Rx Refill/Question >> Oct 12, 2020  4:45 PM Marylen Ponto wrote: Medication: HYDROcodone-acetaminophen (NORCO) 7.5-325 MG tablet  Has the patient contacted their pharmacy? no  Preferred Pharmacy (with phone number or street name): [facility] #[id number] [place],  - [address] AT [facility] [address] [facility] [address] Phone: [phone]   Fax: [phone]  Agent: Please be advised that RX refills may take up to 3 business days. We ask that you follow-up with your pharmacy.

## 2020-10-12 NOTE — Telephone Encounter (Signed)
Requested medications are due for refill today.  yes  Requested medications are on the active medications list.  yes  Last refill. 09/15/2020  Future visit scheduled.   no  Notes to clinic.  Medication not delegated. 

## 2020-10-13 MED ORDER — HYDROCODONE-ACETAMINOPHEN 7.5-325 MG PO TABS: 1.0000 | ORAL_TABLET | Freq: Four times a day (QID) | ORAL | 0 refills | Status: DC | PRN

## 2020-11-09 ENCOUNTER — Other Ambulatory Visit: Payer: Self-pay | Admitting: Family Medicine

## 2020-11-09 NOTE — Telephone Encounter (Signed)
Pt needs appt. Left VM to call back to schedule ?

## 2020-11-09 NOTE — Telephone Encounter (Signed)
Requested medication (s) are due for refill today: No  Requested medication (s) are on the active medication list: Yes  Last refill:  11/09/20  Future visit scheduled: No  Notes to clinic:  Pt. Requesting 90 day supply.    Requested Prescriptions  Pending Prescriptions Disp Refills   topiramate (TOPAMAX) 100 MG tablet [Pharmacy Med Name: TOPIRAMATE 100MG  TABLETS] 180 tablet     Sig: TAKE 1 TABLET BY MOUTH TWICE DAILY      Not Delegated - Neurology: Anticonvulsants - topiramate & zonisamide Failed - 11/09/2020 12:46 PM      Failed - This refill cannot be delegated      Failed - Cr in normal range and within 360 days    Creat  Date Value Ref Range Status  05/23/2017 0.99 (H) 0.60 - 0.93 mg/dL Final    Comment:    For patients >59 years of age, the reference limit for Creatinine is approximately 13% higher for people identified as African-American. .    Creatinine, Ser  Date Value Ref Range Status  04/23/2019 1.16 (H) 0.57 - 1.00 mg/dL Final          Failed - CO2 in normal range and within 360 days    CO2  Date Value Ref Range Status  04/23/2019 19 (L) 20 - 29 mmol/L Final          Failed - Valid encounter within last 12 months    Recent Outpatient Visits           1 year ago Familial hypercholesteremia   [facility], [place]   1 year ago Coronary artery disease involving native coronary artery of native heart without angina pectoris   [facility], [place]   2 years ago Annual physical exam   [facility], [place]   2 years ago Burning with urination   [facility] Chrismon, [facility], PA-C   3 years ago Annual physical exam   [facility], [place]

## 2020-11-11 NOTE — Telephone Encounter (Signed)
Received refill request for refill for topiramate, but this patient has not been seen for over a year. Have sent in 30 day supply, but she needs to be seen in office before any additional refills can be approved.

## 2020-11-13 ENCOUNTER — Other Ambulatory Visit: Payer: Self-pay | Admitting: Family Medicine

## 2020-11-13 DIAGNOSIS — M5136 Other intervertebral disc degeneration, lumbar region: Secondary | ICD-10-CM

## 2020-11-13 NOTE — Telephone Encounter (Signed)
Requested medications are due for refill today yes  Requested medications are on the active medication list ye  Last visit 09/2019  Future visit scheduled 11/2020  Notes to clinic Not Delegated

## 2020-11-13 NOTE — Telephone Encounter (Signed)
Medication Refill - Medication:   HYDROcodone-acetaminophen (NORCO) 7.5-325 MG tablet   Has the patient contacted their pharmacy? Yes.  need doctor approval.   Preferred Pharmacy (with phone number or street name):   [id number] [place] - [address] AT [facility] OF [address]  [address] [place] [address]  Phone: [phone] Fax: [phone]    Agent: Please be advised that RX refills may take up to 3 business days. We ask that you follow-up with your pharmacy.

## 2020-11-15 MED ORDER — HYDROCODONE-ACETAMINOPHEN 7.5-325 MG PO TABS: 1.0000 | ORAL_TABLET | Freq: Four times a day (QID) | ORAL | 0 refills | Status: AC | PRN

## 2020-11-18 ENCOUNTER — Encounter: Payer: Self-pay | Admitting: Family Medicine

## 2020-11-25 DIAGNOSIS — I639 Cerebral infarction, unspecified: Secondary | ICD-10-CM | POA: Insufficient documentation

## 2020-11-26 ENCOUNTER — Encounter: Payer: Self-pay | Admitting: Family Medicine

## 2020-11-26 DIAGNOSIS — I679 Cerebrovascular disease, unspecified: Secondary | ICD-10-CM | POA: Insufficient documentation

## 2020-11-27 DIAGNOSIS — I4891 Unspecified atrial fibrillation: Secondary | ICD-10-CM | POA: Insufficient documentation

## 2020-11-27 DIAGNOSIS — I213 ST elevation (STEMI) myocardial infarction of unspecified site: Secondary | ICD-10-CM | POA: Insufficient documentation

## 2020-12-01 DIAGNOSIS — R5381 Other malaise: Secondary | ICD-10-CM | POA: Insufficient documentation

## 2020-12-01 DIAGNOSIS — J690 Pneumonitis due to inhalation of food and vomit: Secondary | ICD-10-CM | POA: Insufficient documentation

## 2020-12-01 DIAGNOSIS — J9601 Acute respiratory failure with hypoxia: Secondary | ICD-10-CM | POA: Insufficient documentation

## 2020-12-01 DIAGNOSIS — Z9889 Other specified postprocedural states: Secondary | ICD-10-CM | POA: Insufficient documentation

## 2020-12-01 DIAGNOSIS — Z9289 Personal history of other medical treatment: Secondary | ICD-10-CM | POA: Insufficient documentation

## 2020-12-03 ENCOUNTER — Ambulatory Visit: Payer: 59 | Admitting: Family Medicine

## 2020-12-03 NOTE — Progress Notes (Deleted)
Established patient visit   Patient: Morgan Lynch   DOB: [birth year]   75 y.o. Female  MRN: [id number] Visit Date: 12/03/2020  Today's healthcare provider: Dortha Kern, PA-C   No chief complaint on file.  Subjective    HPI  Hypothyroid, follow-up  Lab Results  Component Value Date   TSH 2.710 04/23/2019   TSH 0.039 (L) 05/24/2018   TSH 0.021 (L) 06/18/2015   Wt Readings from Last 3 Encounters:  10/04/19 205 lb (93 kg)  06/24/19 199 lb (90.3 kg)  05/24/18 197 lb (89.4 kg)    She was last seen for hypothyroid 1 years ago.  Management since that visit includes no changes. She reports {excellent/good/fair/poor:19665} compliance with treatment. She {is/is not:21021397} having side effects. {document side effects if present:1}  Symptoms: {Yes/No:20286} change in energy level {Yes/No:20286} constipation  {Yes/No:20286} diarrhea {Yes/No:20286} heat / cold intolerance  {Yes/No:20286} nervousness {Yes/No:20286} palpitations  {Yes/No:20286} weight changes    ----------------------------------------------------------------------------------------- Lipid/Cholesterol, Follow-up  Last lipid panel Other pertinent labs  Lab Results  Component Value Date   CHOL 282 (H) 04/23/2019   HDL 48 04/23/2019   LDLCALC 191 (H) 04/23/2019   TRIG 225 (H) 04/23/2019   CHOLHDL 5.9 (H) 04/23/2019   Lab Results  Component Value Date   ALT 15 04/23/2019   AST 18 04/23/2019   PLT 308 05/15/2018   TSH 2.710 04/23/2019     She was last seen for this 1 years ago.  Management since that visit includes no changes.  She reports {excellent/good/fair/poor:19665} compliance with treatment. She {is/is not:9024} having side effects. {document side effects if present:1}  Symptoms: {Yes/No:20286} chest pain {Yes/No:20286} chest pressure/discomfort  {Yes/No:20286} dyspnea {Yes/No:20286} lower extremity edema  {Yes/No:20286} numbness or tingling of extremity {Yes/No:20286} orthopnea   {Yes/No:20286} palpitations {Yes/No:20286} paroxysmal nocturnal dyspnea  {Yes/No:20286} speech difficulty {Yes/No:20286} syncope   Current diet: {diet habits:16563} Current exercise: {exercise types:16438}  The ASCVD Risk score Denman George DC Jr., et al., 2013) failed to calculate for the following reasons:   The patient has a prior MI or stroke diagnosis  --------------------------------------------------------------------------------------------------- Depression, Follow-up  She  was last seen for this 1 years ago. Changes made at last visit include no changes.   She reports {excellent/good/fair/poor:19665} compliance with treatment. She {is/is not:21021397} having side effects. ***  She reports {DESC; GOOD/FAIR/POOR:18685} tolerance of treatment. Current symptoms include: {Symptoms; depression:1002} She feels she is {improved/worse/unchanged:3041574} since last visit.  Depression screen [facility] 2/9 07/22/2020 07/17/2019 07/17/2019  Decreased Interest 0 0 0  Down, Depressed, Hopeless 0 0 0  PHQ - 2 Score 0 0 0  Altered sleeping - - -  Tired, decreased energy - - -  Change in appetite - - -  Feeling bad or failure about yourself  - - -  Trouble concentrating - - -  Moving slowly or fidgety/restless - - -  Suicidal thoughts - - -  PHQ-9 Score - - -  Difficult doing work/chores - - -    -----------------------------------------------------------------------------------------   Patient Active Problem List   Diagnosis Date Noted   Cerebrovascular disease 11/26/2020   Primary osteoarthritis of both knees 06/24/2019   Vulvar abscess 01/31/2017   Cervical spondylosis with myelopathy 10/25/2016   Right-sided Bell's palsy 05/20/2016   Reaction, situational, acute, to stress 05/20/2016   Adiposity 11/12/2015   Fibromyalgia 10/29/2015   Chronic pain 10/29/2015   Clinical depression 10/29/2015   Change in bowel function 03/04/2015   Vitamin D deficiency 03/04/2015   Obesity 03/04/2015  Hypersomnia 03/04/2015   Allergic rhinitis 03/04/2015   GERD (gastroesophageal reflux disease) 03/04/2015   Degenerative arthritis of spine 03/04/2015   Facial numbness 03/04/2015   Flatulence 03/04/2015   History of TIA (transient ischemic attack) 08/14/2014   Osteopenia 05/22/2014   Malaise and fatigue 02/23/2010   Alopecia 12/09/2009   Constipation 02/29/2008   CAD (coronary artery disease) 05/09/2007   Edema 03/22/2007   DDD (degenerative disc disease), lumbar 12/09/2006   Cerebral artery occlusion with cerebral infarction (HCC) 12/09/2006   Anxiety state 11/01/2006   Hypothyroidism 07/30/2006   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 mild LVH   MRI BRAIN,BRAIN STEM  01/04/2013   Chronic Ischemic changes. Old lacunar infarct. Severe sinusitis   MYOCARDIAL PERFUSION SCAN  04/05/2013   Normal   pulmonary function test  07/21/2010   Normal; Done by Dr. Welton Flakes   SPINAL FUSION  1988   lower back   TOTAL HIP ARTHROPLASTY Left 2006   VULVAR LESION REMOVAL N/A 02/01/2017   Procedure: INCISION AND DRAINAGE VULVAR ABSCESS;  Surgeon: Suzy Bouchard, MD;  Location: [facility] ORS;  Service: Gynecology;  Laterality: N/A;   Social History   Tobacco Use   Smoking status: Former    Packs/day: 0.50    Years: 8.00    Pack years: 4.00    Types: Cigarettes   Smokeless tobacco: Never   Tobacco comments:    quit smoking in 1980s   Vaping Use   Vaping Use: Every day  Substance Use Topics   Alcohol use: No    Alcohol/week: 0.0 standard drinks   Drug use: Yes    Types: Marijuana    Comment: uses marijuana   Family History  Problem Relation Age of Onset   Cancer Mother        female cancer (?)   Pancreatic cancer Father    Colon polyps Father    Breast cancer Maternal Aunt    Multiple sclerosis Daughter    Kidney failure Son    Hypertension Son    Allergies  Allergen Reactions   Repatha [Evolocumab] Rash    Swelling in throat and used Epipen   Colestid [Colestipol Hcl]     Abdominal cramping   Oysters  [Shellfish Allergy]    Rosuvastatin     Myalgias    Simvastatin     Muscle Aches       Medications: Outpatient Medications Prior to Visit  Medication Sig   FLUoxetine (PROZAC) 20 MG capsule TAKE 3 CAPSULES(60 MG) BY MOUTH DAILY   omeprazole (PRILOSEC) 20 MG capsule TAKE 1 CAPSULE(20 MG) BY MOUTH DAILY   tiZANidine (ZANAFLEX) 4 MG tablet TAKE 1 TABLET BY MOUTH EVERY 8 HOURS AS NEEDED   topiramate (TOPAMAX) 100 MG tablet TAKE 1 TABLET BY MOUTH TWICE DAILY   albuterol (VENTOLIN HFA) 108 (90 Base) MCG/ACT inhaler INHALE 2 PUFFS BY MOUTH EVERY 6 HOURS AS NEEDED   Armodafinil  250 MG tablet Take 1 tablet (250 mg total) by mouth daily.   aspirin 81 MG tablet Take 81 mg by mouth daily.    atenolol-chlorthalidone (TENORETIC) 50-25 MG tablet TAKE 1/2 TABLET BY MOUTH DAILY (Patient not taking: Reported on 07/22/2020)   Biotin 1 MG CAPS Take by mouth daily.    Cholecalciferol 125 MCG (5000 UT) capsule Take by mouth as directed. Takes 10 tablets once a week.   colestipol (COLESTID) 1 g tablet Take 1 tablet (1 g total) by mouth 2 (two) times daily. (Patient not taking: Reported on 07/22/2020)   diphenhydrAMINE (BENADRYL) 25 MG tablet Take by mouth every 6 (six) hours as needed.    EPINEPHRINE 0.3 mg/0.3 mL IJ SOAJ injection USE AS DIRECTED   ezetimibe (ZETIA) 10 MG tablet TAKE 1 TABLET(10 MG) BY MOUTH DAILY    Flaxseed, Linseed, (FLAX SEED OIL) 1000 MG CAPS Take 1,000 mg by mouth daily.   fluticasone (FLONASE) 50 MCG/ACT nasal spray SHAKE LIQUID AND USE 2 SPRAYS IN EACH NOSTRIL EVERY NIGHT AT BEDTIME   HYDROcodone-acetaminophen (NORCO) 7.5-325 MG tablet Take 1 tablet by mouth every 6 (six) hours as needed for moderate pain.   ipratropium (ATROVENT) 0.06 % nasal spray USE 2 SPRAYS IN EACH NOSTRIL THREE TIMES DAILY   levothyroxine (SYNTHROID) 125 MCG tablet TAKE 1 TABLET(125 MCG) BY MOUTH DAILY   Magnesium 500 MG CAPS Take by mouth 2 (two) times daily.    Melatonin 10 MG CAPS Take 10 mg by mouth daily at 6 (six) AM.   montelukast (SINGULAIR) 10 MG tablet Take 1 tablet (10 mg total) by mouth daily.   Multiple Vitamins-Minerals (CENTRUM SILVER 50+MEN) TABS Take by mouth daily.    naproxen (NAPROSYN) 500 MG tablet TAKE 1 TABLET(500 MG) BY MOUTH TWICE DAILY AS NEEDED   PRESCRIPTION MEDICATION CPAP every night (Patient not taking: Reported on 07/22/2020)   pyridoxine (B-6) 100 MG tablet Take by mouth daily. Unsure dose - takes occasionally   triamcinolone cream (KENALOG) 0.5 % APPLY EXTERNALLY TO THE AFFECTED AREA DAILY AS NEEDED   Vitamin D, Ergocalciferol, (DRISDOL) 50000 UNITS CAPS capsule Take 50,000 Units by mouth every 7 (seven) days.  (Patient not taking: Reported on 07/22/2020)   vitamin E (VITAMIN E) 180 MG (400 UNITS) capsule Take 400 Units by mouth daily.   zinc gluconate 50 MG tablet Take 100 mg by mouth daily.   No facility-administered medications prior to visit.    Review of Systems  Constitutional:  Negative for activity change and appetite change.  Respiratory:  Negative for chest tightness and shortness of breath.   Cardiovascular:  Negative for chest pain.  Gastrointestinal:  Negative for abdominal distention.   Last vitamin D Lab Results  Component Value Date   VD25OH 50.7 04/23/2019       Objective    There were no vitals taken for this visit. BP Readings from Last 3  Encounters:  10/04/19 133/78  06/24/19 138/72  05/24/18 136/64   Wt Readings from Last 3 Encounters:  10/04/19 205 lb (93 kg)  06/24/19 199 lb (90.3 kg)  05/24/18 197 lb (89.4 kg)       Physical Exam  ***  No results found for any visits on 12/03/20.  Assessment & Plan     ***  No follow-ups on file.      {provider attestation***:1}   Dortha Kern, Cordelia Poche  [facility] [phone] (phone) [phone] (fax)  [facility]

## 2020-12-09 ENCOUNTER — Other Ambulatory Visit: Payer: Self-pay | Admitting: Family Medicine

## 2020-12-24 ENCOUNTER — Other Ambulatory Visit: Payer: Self-pay | Admitting: Family Medicine

## 2020-12-25 ENCOUNTER — Other Ambulatory Visit: Payer: Self-pay | Admitting: Family Medicine

## 2020-12-25 NOTE — Telephone Encounter (Signed)
  Notes to clinic:  Patient requesting 90 day supply    Requested Prescriptions  Pending Prescriptions Disp Refills   topiramate (TOPAMAX) 100 MG tablet [Pharmacy Med Name: TOPIRAMATE 100MG  TABLETS] 180 tablet     Sig: TAKE 1 TABLET BY MOUTH TWICE DAILY      Not Delegated - Neurology: Anticonvulsants - topiramate & zonisamide Failed - 12/25/2020  8:10 AM      Failed - This refill cannot be delegated      Failed - Cr in normal range and within 360 days    Creat  Date Value Ref Range Status  05/23/2017 0.99 (H) 0.60 - 0.93 mg/dL Final    Comment:    For patients >45 years of age, the reference limit for Creatinine is approximately 13% higher for people identified as African-American. .    Creatinine, Ser  Date Value Ref Range Status  04/23/2019 1.16 (H) 0.57 - 1.00 mg/dL Final          Failed - CO2 in normal range and within 360 days    CO2  Date Value Ref Range Status  04/23/2019 19 (L) 20 - 29 mmol/L Final          Failed - Valid encounter within last 12 months    Recent Outpatient Visits           1 year ago Familial hypercholesteremia   [facility], [place]   1 year ago Coronary artery disease involving native coronary artery of native heart without angina pectoris   [facility], [place]   2 years ago Annual physical exam   [facility], [place]   2 years ago Burning with urination   [facility] Chrismon, [facility], PA-C   3 years ago Annual physical exam   [facility], [place]

## 2021-02-14 ENCOUNTER — Other Ambulatory Visit: Payer: Self-pay | Admitting: Family Medicine

## 2021-02-14 NOTE — Telephone Encounter (Signed)
Requested medication (s) are due for refill today: yes  Requested medication (s) are on the active medication list: yes  Last refill:  11/09/20  Future visit scheduled: no  Notes to clinic:  Attempted to call pt x 2 - call would not ring then dropped   Requested Prescriptions  Pending Prescriptions Disp Refills   omeprazole (PRILOSEC) 20 MG capsule [Pharmacy Med Name: OMEPRAZOLE 20MG  CAPSULES] 90 capsule 0    Sig: TAKE 1 CAPSULE(20 MG) BY MOUTH DAILY     Gastroenterology: Proton Pump Inhibitors Failed - 02/14/2021 12:33 PM      Failed - Valid encounter within last 12 months    Recent Outpatient Visits           1 year ago Familial hypercholesteremia   [facility], [place]   1 year ago Coronary artery disease involving native coronary artery of native heart without angina pectoris   [facility], [place]   2 years ago Annual physical exam   [facility], [place]   2 years ago Burning with urination   [facility] Chrismon, [facility], PA-C   3 years ago Annual physical exam   [facility], MD

## 2021-08-21 ENCOUNTER — Other Ambulatory Visit: Payer: Self-pay | Admitting: Family Medicine

## 2021-11-19 ENCOUNTER — Other Ambulatory Visit: Payer: Self-pay | Admitting: Family Medicine
